# Patient Record
Sex: Male | Born: 1990 | Hispanic: Yes | Marital: Married | Smoking: Never smoker
Health system: Southern US, Community
[De-identification: ages and names within clinical notes are randomized; demographics above are authoritative.]

---

## 2013-12-11 ENCOUNTER — Emergency Department (HOSPITAL_COMMUNITY): Payer: Medicaid Other

## 2013-12-11 ENCOUNTER — Inpatient Hospital Stay (HOSPITAL_COMMUNITY)
Admission: EM | Admit: 2013-12-11 | Discharge: 2013-12-15 | DRG: 387 | Disposition: A | Discharge status: Home / Self Care | Payer: Medicaid Other | Attending: Internal Medicine | Admitting: Internal Medicine

## 2013-12-11 ENCOUNTER — Encounter (HOSPITAL_COMMUNITY): Payer: Self-pay | Admitting: Emergency Medicine

## 2013-12-11 DIAGNOSIS — S0101XA Laceration without foreign body of scalp, initial encounter: Secondary | ICD-10-CM | POA: Diagnosis present

## 2013-12-11 DIAGNOSIS — E876 Hypokalemia: Secondary | ICD-10-CM

## 2013-12-11 DIAGNOSIS — K5289 Other specified noninfective gastroenteritis and colitis: Secondary | ICD-10-CM

## 2013-12-11 DIAGNOSIS — R55 Syncope and collapse: Secondary | ICD-10-CM

## 2013-12-11 DIAGNOSIS — R296 Repeated falls: Secondary | ICD-10-CM | POA: Diagnosis present

## 2013-12-11 DIAGNOSIS — R109 Unspecified abdominal pain: Secondary | ICD-10-CM | POA: Diagnosis not present

## 2013-12-11 DIAGNOSIS — K5000 Crohn's disease of small intestine without complications: Secondary | ICD-10-CM | POA: Diagnosis not present

## 2013-12-11 DIAGNOSIS — D72829 Elevated white blood cell count, unspecified: Secondary | ICD-10-CM | POA: Diagnosis present

## 2013-12-11 DIAGNOSIS — K529 Noninfective gastroenteritis and colitis, unspecified: Secondary | ICD-10-CM

## 2013-12-11 DIAGNOSIS — R197 Diarrhea, unspecified: Secondary | ICD-10-CM

## 2013-12-11 DIAGNOSIS — S0100XA Unspecified open wound of scalp, initial encounter: Secondary | ICD-10-CM | POA: Diagnosis present

## 2013-12-11 DIAGNOSIS — A080 Rotaviral enteritis: Secondary | ICD-10-CM

## 2013-12-11 LAB — CBC WITH DIFFERENTIAL/PLATELET
BASOS PCT: 0 % (ref 0–1)
Basophils Absolute: 0 10*3/uL (ref 0.0–0.1)
EOS ABS: 0 10*3/uL (ref 0.0–0.7)
Eosinophils Relative: 0 % (ref 0–5)
HCT: 47.1 % (ref 39.0–52.0)
HEMOGLOBIN: 16.3 g/dL (ref 13.0–17.0)
Lymphocytes Relative: 10 % — ABNORMAL LOW (ref 12–46)
Lymphs Abs: 1.6 10*3/uL (ref 0.7–4.0)
MCH: 30 pg (ref 26.0–34.0)
MCHC: 34.6 g/dL (ref 30.0–36.0)
MCV: 86.7 fL (ref 78.0–100.0)
MONOS PCT: 11 % (ref 3–12)
Monocytes Absolute: 1.7 10*3/uL — ABNORMAL HIGH (ref 0.1–1.0)
Neutro Abs: 12.5 10*3/uL — ABNORMAL HIGH (ref 1.7–7.7)
Neutrophils Relative %: 79 % — ABNORMAL HIGH (ref 43–77)
PLATELETS: 201 10*3/uL (ref 150–400)
RBC: 5.43 MIL/uL (ref 4.22–5.81)
RDW: 12.9 % (ref 11.5–15.5)
WBC: 15.9 10*3/uL — ABNORMAL HIGH (ref 4.0–10.5)

## 2013-12-11 LAB — LIPASE, BLOOD: LIPASE: 18 U/L (ref 11–59)

## 2013-12-11 LAB — COMPREHENSIVE METABOLIC PANEL
ALBUMIN: 4.1 g/dL (ref 3.5–5.2)
ALK PHOS: 69 U/L (ref 39–117)
ALT: 18 U/L (ref 0–53)
AST: 19 U/L (ref 0–37)
BUN: 18 mg/dL (ref 6–23)
CO2: 22 mEq/L (ref 19–32)
Calcium: 9.3 mg/dL (ref 8.4–10.5)
Chloride: 101 mEq/L (ref 96–112)
Creatinine, Ser: 0.89 mg/dL (ref 0.50–1.35)
GFR calc Af Amer: >90 mL/min (ref >90)
GFR calc non Af Amer: >90 mL/min (ref >90)
Glucose, Bld: 120 mg/dL — ABNORMAL HIGH (ref 70–99)
Potassium: 3.4 mEq/L — ABNORMAL LOW (ref 3.7–5.3)
Sodium: 138 mEq/L (ref 137–147)
TOTAL PROTEIN: 7 g/dL (ref 6.0–8.3)
Total Bilirubin: 0.7 mg/dL (ref 0.3–1.2)

## 2013-12-11 MED ORDER — TETANUS-DIPHTH-ACELL PERTUSSIS 5-2.5-18.5 LF-MCG/0.5 IM SUSP
0.5000 mL | Freq: Once | INTRAMUSCULAR | Status: AC
  Administered 2013-12-11: 0.5 mL via INTRAMUSCULAR
  Filled 2013-12-11: qty 0.5

## 2013-12-11 MED ORDER — CIPROFLOXACIN IN D5W 400 MG/200ML IV SOLN
400.0000 mg | Freq: Once | INTRAVENOUS | Status: AC
  Administered 2013-12-11: 400 mg via INTRAVENOUS
  Filled 2013-12-11: qty 200

## 2013-12-11 MED ORDER — HYDROMORPHONE HCL PF 1 MG/ML IJ SOLN
1.0000 mg | Freq: Once | INTRAMUSCULAR | Status: AC
  Administered 2013-12-11: 1 mg via INTRAVENOUS
  Filled 2013-12-11: qty 1

## 2013-12-11 MED ORDER — METRONIDAZOLE IN NACL 5-0.79 MG/ML-% IV SOLN
500.0000 mg | Freq: Once | INTRAVENOUS | Status: AC
  Administered 2013-12-11: 500 mg via INTRAVENOUS
  Filled 2013-12-11: qty 100

## 2013-12-11 MED ORDER — ONDANSETRON HCL 4 MG/2ML IJ SOLN
4.0000 mg | Freq: Once | INTRAMUSCULAR | Status: AC
  Administered 2013-12-11: 4 mg via INTRAVENOUS
  Filled 2013-12-11: qty 2

## 2013-12-11 MED ORDER — IOHEXOL 300 MG/ML  SOLN
80.0000 mL | Freq: Once | INTRAMUSCULAR | Status: AC | PRN
  Administered 2013-12-11: 80 mL via INTRAVENOUS

## 2013-12-11 MED ORDER — SODIUM CHLORIDE 0.9 % IV BOLUS (SEPSIS)
1000.0000 mL | Freq: Once | INTRAVENOUS | Status: AC
  Administered 2013-12-11: 1000 mL via INTRAVENOUS

## 2013-12-11 MED ORDER — SODIUM CHLORIDE 0.9 % IV SOLN
INTRAVENOUS | Status: DC
  Administered 2013-12-11: via INTRAVENOUS

## 2013-12-11 NOTE — Consult Note (Signed)
Reason for Consult:  Acute abdominal pain Referring Physician:  Dr. Pattricia Boss  Erving Sassano is an 23 y.o. male.  HPI: Interviewed via phone interpreter  This is a 23 yo male in good health who presents with about 24 hours of diffuse abdominal pain.  He has had one episode of vomiting, but denies diarrhea.  He tried to eat a meal today, but the pain worsened.  He was in the bathroom and became syncopal.  He fell and hit the back of his head.  Scalp laceration is not bleeding.  He reports some subjective fever.  The pain is much improved and he is only tender with palpation.  History reviewed. No pertinent past medical history.  No past surgical history on file.  No family history on file.  Social History:  reports that he has never smoked. He does not have any smokeless tobacco history on file. He reports that he does not drink alcohol or use illicit drugs.  Allergies: No Known Allergies  Medications:  Prior to Admission medications   Medication Sig Start Date End Date Taking? Authorizing Provider  ibuprofen (ADVIL,MOTRIN) 200 MG tablet Take 200 mg by mouth every 6 (six) hours as needed.   Yes Historical Provider, MD     Results for orders placed during the hospital encounter of 12/11/13 (from the past 48 hour(s))  CBC WITH DIFFERENTIAL     Status: Abnormal   Collection Time    12/11/13  9:00 PM      Result Value Ref Range   WBC 15.9 (*) 4.0 - 10.5 K/uL   RBC 5.43  4.22 - 5.81 MIL/uL   Hemoglobin 16.3  13.0 - 17.0 g/dL   HCT 47.1  39.0 - 52.0 %   MCV 86.7  78.0 - 100.0 fL   MCH 30.0  26.0 - 34.0 pg   MCHC 34.6  30.0 - 36.0 g/dL   RDW 12.9  11.5 - 15.5 %   Platelets 201  150 - 400 K/uL   Neutrophils Relative % 79 (*) 43 - 77 %   Neutro Abs 12.5 (*) 1.7 - 7.7 K/uL   Lymphocytes Relative 10 (*) 12 - 46 %   Lymphs Abs 1.6  0.7 - 4.0 K/uL   Monocytes Relative 11  3 - 12 %   Monocytes Absolute 1.7 (*) 0.1 - 1.0 K/uL   Eosinophils Relative 0  0 - 5 %   Eosinophils  Absolute 0.0  0.0 - 0.7 K/uL   Basophils Relative 0  0 - 1 %   Basophils Absolute 0.0  0.0 - 0.1 K/uL  COMPREHENSIVE METABOLIC PANEL     Status: Abnormal   Collection Time    12/11/13  9:00 PM      Result Value Ref Range   Sodium 138  137 - 147 mEq/L   Potassium 3.4 (*) 3.7 - 5.3 mEq/L   Chloride 101  96 - 112 mEq/L   CO2 22  19 - 32 mEq/L   Glucose, Bld 120 (*) 70 - 99 mg/dL   BUN 18  6 - 23 mg/dL   Creatinine, Ser 0.89  0.50 - 1.35 mg/dL   Calcium 9.3  8.4 - 10.5 mg/dL   Total Protein 7.0  6.0 - 8.3 g/dL   Albumin 4.1  3.5 - 5.2 g/dL   AST 19  0 - 37 U/L   ALT 18  0 - 53 U/L   Alkaline Phosphatase 69  39 - 117 U/L   Total Bilirubin  0.7  0.3 - 1.2 mg/dL   GFR calc non Af Amer >90  >90 mL/min   GFR calc Af Amer >90  >90 mL/min   Comment: (NOTE)     The eGFR has been calculated using the CKD EPI equation.     This calculation has not been validated in all clinical situations.     eGFR's persistently <90 mL/min signify possible Chronic Kidney     Disease.  LIPASE, BLOOD     Status: None   Collection Time    12/11/13  9:00 PM      Result Value Ref Range   Lipase 18  11 - 59 U/L    Ct Head Wo Contrast  12/11/2013   CLINICAL DATA:  Syncopal episode, struck back of head. Abdominal pain, nausea, and vomiting.  EXAM: CT HEAD WITHOUT CONTRAST  TECHNIQUE: Contiguous axial images were obtained from the base of the skull through the vertex without intravenous contrast.  COMPARISON:  None.  FINDINGS: Ventricles and sulci are symmetrical. No mass effect or midline shift. No abnormal extra-axial fluid collections. Gray-white matter junctions are distinct. Basal cisterns are not effaced. No evidence of acute intracranial hemorrhage. No depressed skull fractures. Mild mucosal thickening in the paranasal sinuses.  IMPRESSION: No acute intracranial abnormalities.   Electronically Signed   By: Lucienne Capers M.D.   On: 12/11/2013 22:13   CLINICAL DATA: Abdominal pain, nausea and vomiting.  Syncopal  episode. Sudden onset of pain.  EXAM:  CT ABDOMEN AND PELVIS WITH CONTRAST  TECHNIQUE:  Multidetector CT imaging of the abdomen and pelvis was performed  using the standard protocol following bolus administration of  intravenous contrast.  CONTRAST: 14m OMNIPAQUE IOHEXOL 300 MG/ML SOLN  COMPARISON: None.  FINDINGS:  Lung bases are clear. The liver, spleen, gallbladder, pancreas,  adrenal glands, kidneys, abdominal aorta, and retroperitoneal lymph  nodes are unremarkable. The stomach and most of the small bowel are  decompressed. The colon is diffusely fluid-filled, most  predominantly in the right colon and the terminal ileum is also  fluid-filled with air-fluid levels. There is no small bowel  distention. There is evidence of small bowel wall thickening of the  terminal ileum and intermittent wall thickening throughout the  colon. Changes suggest an inflammatory process such is antra  colitis. This could represent infectious or inflammatory etiology.  Ischemia is felt less likely as there is no portal venous gas,  pneumatosis, and flow in the mesenteric vessels is normal appearing.  The appendix measures 8 mm in diameter. This is borderline and early  appendicitis is not excluded. Small amount of free fluid in the  right lower quadrant and mesenteries likely reactive.  Pelvis: No evidence of diverticulitis. The bladder wall is not  thickened. Small amount of free fluid in the pelvis is likely to be  reactive. No destructive bone lesions appreciated.  IMPRESSION:  Wall thickening and fluid in the terminal ileum and colon, with  small amount of free fluid in the right lower quadrant and pelvis  likely reactive. Changes likely to represent enterocolitis of  nonspecific etiology. Borderline appendiceal diameter.  Electronically Signed  By: WLucienne CapersM.D.   On: 12/11/2013 22:24   Review of Systems  Constitutional: Negative for weight loss.  HENT: Negative for ear  discharge, ear pain, hearing loss and tinnitus.   Eyes: Negative for blurred vision, double vision, photophobia and pain.  Respiratory: Negative for cough, sputum production and shortness of breath.   Cardiovascular: Negative for chest pain.  Gastrointestinal: Positive for abdominal pain. Negative for nausea and vomiting.  Genitourinary: Negative for dysuria, urgency, frequency and flank pain.  Musculoskeletal: Negative for back pain, falls, joint pain, myalgias and neck pain.  Neurological: Negative for dizziness, tingling, sensory change, focal weakness, loss of consciousness and headaches.  Endo/Heme/Allergies: Does not bruise/bleed easily.  Psychiatric/Behavioral: Negative for depression, memory loss and substance abuse. The patient is not nervous/anxious.    Blood pressure 131/78, pulse 97, temperature 97.6 F (36.4 C), temperature source Oral, resp. rate 17, height '5\' 11"'  (1.803 m), weight 180 lb (81.647 kg), SpO2 97.00%. Physical Exam WDWN in NAD HEENT:  EOMI, sclera anicteric Neck:  No masses, no thyromegaly Lungs:  CTA bilaterally; normal respiratory effort CV:  Regular rate and rhythm; no murmurs Abd:  +bowel sounds, soft, non-distended; mildly tender in all four quadrants Ext:  Well-perfused; no edema Skin:  Warm, dry; no sign of jaundice  Assessment/Plan: 1.  Syncopal episode with fall/ scalp laceration - no sign of intracranial injury; laceration stapled in ED 2.  Diffuse abdominal pain - improved in ED; appears to inflammatory or infectious; not really consistent with ischemic etiology (young age, no other sign of vascular disease).  This may have been going on for awhile, since his CT scan appears to show Pepto-Bismol in his colon.    Recs: No acute indications for surgical intervention Rehydration Bowel rest Consider stool studies if he begins to have diarrhea Empiric antibiotics to cover common GI pathogens If no improvement with conservative measures, consider GI  consult [facility] will continue to follow with you  Imogene Burn. Tzirel Leonor 12/11/2013, 10:25 PM

## 2013-12-11 NOTE — ED Provider Notes (Signed)
CSN: [id number]     Arrival date & time 12/11/13  2031 History   First MD Initiated Contact with Patient 12/11/13 2036     Chief Complaint  Patient presents with  . Abdominal Pain     (Consider location/radiation/quality/duration/timing/severity/associated sxs/prior Treatment) HPI 23 year old male came in initially stated he had a sudden onset of abdominal pain and a syncopal episode. Moaning and complaining of diffuse abdominal pain. After the initial evaluation more thorough history was obtained through a translator. He states that he had some abdominal discomfort that began last night and persisted through the night. He had a sudden onset of severe pain after eating today. He then had a syncopal episode that caused a laceration to his head. He had severe diffuse abdominal pain after eating. He has not had emesis or diarrhea, fever, chills, or urinary symptoms. He has not had any similar episodes in the past. History reviewed. No pertinent past medical history. No past surgical history on file. No family history on file. History  Substance Use Topics  . Smoking status: Never Smoker   . Smokeless tobacco: Not on file  . Alcohol Use: No    Review of Systems  Constitutional: Negative.   HENT: Negative.   Eyes: Negative.   Respiratory: Negative.   Cardiovascular: Negative.   Gastrointestinal: Positive for nausea and abdominal pain. Negative for vomiting and diarrhea.  Endocrine: Negative.   Genitourinary: Negative.   Musculoskeletal: Negative.   Skin: Negative.   Allergic/Immunologic: Negative.   Neurological: Negative.   Hematological: Negative.   Psychiatric/Behavioral: Negative.   All other systems reviewed and are negative.     Allergies  Review of patient's allergies indicates no known allergies.  Home Medications   Prior to Admission medications   Medication Sig Start Date End Date Taking? Authorizing Provider  ibuprofen (ADVIL,MOTRIN) 200 MG tablet Take 200 mg by  mouth every 6 (six) hours as needed.   Yes Historical Provider, MD   BP 131/78  Pulse 97  Temp(Src) 97.6 F (36.4 C) (Oral)  Resp 17  Ht 5\' 11"  (1.803 m)  Wt 180 lb (81.647 kg)  BMI 25.12 kg/m2  SpO2 97% Physical Exam  Nursing note and vitals reviewed. Constitutional: He is oriented to person, place, and time. He appears well-developed and well-nourished.  HENT:  Head: Normocephalic.  Right Ear: External ear normal.  Left Ear: External ear normal.  Nose: Nose normal.  Mouth/Throat: Oropharynx is clear and moist.  4 cm laceration to occiput  Eyes: Conjunctivae are normal. Pupils are equal, round, and reactive to light.  Neck: Normal range of motion. Neck supple.  Cardiovascular: Normal rate, regular rhythm, normal heart sounds and intact distal pulses.   Pulmonary/Chest: Breath sounds normal.  Abdominal: Bowel sounds are normal. He exhibits no distension and no mass. There is tenderness. There is guarding. There is no rebound.  Diffuse tenderness to palpation  Musculoskeletal: Normal range of motion.  Neurological: He is alert and oriented to person, place, and time. He has normal reflexes. He displays normal reflexes. No cranial nerve deficit. He exhibits normal muscle tone.  Skin: Skin is warm and dry.  Psychiatric: He has a normal mood and affect. His behavior is normal. Judgment and thought content normal.    ED Course  Procedures (including critical care time) Labs Review Labs Reviewed  CBC WITH DIFFERENTIAL - Abnormal; Notable for the following:    WBC 15.9 (*)    Neutrophils Relative % 79 (*)    Neutro Abs 12.5 (*)  Lymphocytes Relative 10 (*)    Monocytes Absolute 1.7 (*)    All other components within normal limits  COMPREHENSIVE METABOLIC PANEL - Abnormal; Notable for the following:    Potassium 3.4 (*)    Glucose, Bld 120 (*)    All other components within normal limits  LIPASE, BLOOD  URINALYSIS, ROUTINE W REFLEX MICROSCOPIC    Imaging Review Ct Head  Wo Contrast  12/11/2013   CLINICAL DATA:  Syncopal episode, struck back of head. Abdominal pain, nausea, and vomiting.  EXAM: CT HEAD WITHOUT CONTRAST  TECHNIQUE: Contiguous axial images were obtained from the base of the skull through the vertex without intravenous contrast.  COMPARISON:  None.  FINDINGS: Ventricles and sulci are symmetrical. No mass effect or midline shift. No abnormal extra-axial fluid collections. Gray-white matter junctions are distinct. Basal cisterns are not effaced. No evidence of acute intracranial hemorrhage. No depressed skull fractures. Mild mucosal thickening in the paranasal sinuses.  IMPRESSION: No acute intracranial abnormalities.   Electronically Signed   By: Burman NievesWilliam  Stevens M.D.   On: 12/11/2013 22:13   Ct Abdomen Pelvis W Contrast  12/11/2013   CLINICAL DATA:  Abdominal pain, nausea and vomiting. Syncopal episode. Sudden onset of pain.  EXAM: CT ABDOMEN AND PELVIS WITH CONTRAST  TECHNIQUE: Multidetector CT imaging of the abdomen and pelvis was performed using the standard protocol following bolus administration of intravenous contrast.  CONTRAST:  80mL OMNIPAQUE IOHEXOL 300 MG/ML  SOLN  COMPARISON:  None.  FINDINGS: Lung bases are clear. The liver, spleen, gallbladder, pancreas, adrenal glands, kidneys, abdominal aorta, and retroperitoneal lymph nodes are unremarkable. The stomach and most of the small bowel are decompressed. The colon is diffusely fluid-filled, most predominantly in the right colon and the terminal ileum is also fluid-filled with air-fluid levels. There is no small bowel distention. There is evidence of small bowel wall thickening of the terminal ileum and intermittent wall thickening throughout the colon. Changes suggest an inflammatory process such is antra colitis. This could represent infectious or inflammatory etiology. Ischemia is felt less likely as there is no portal venous gas, pneumatosis, and flow in the mesenteric vessels is normal appearing. The  appendix measures 8 mm in diameter. This is borderline and early appendicitis is not excluded. Small amount of free fluid in the right lower quadrant and mesenteries likely reactive.  Pelvis: No evidence of diverticulitis. The bladder wall is not thickened. Small amount of free fluid in the pelvis is likely to be reactive. No destructive bone lesions appreciated.  IMPRESSION: Wall thickening and fluid in the terminal ileum and colon, with small amount of free fluid in the right lower quadrant and pelvis likely reactive. Changes likely to represent enterocolitis of nonspecific etiology. Borderline appendiceal diameter.   Electronically Signed   By: Burman NievesWilliam  Stevens M.D.   On: 12/11/2013 22:24   Dg Abd Acute W/chest  12/11/2013   CLINICAL DATA:  Acute lower abdominal pain.  EXAM: ACUTE ABDOMEN SERIES (ABDOMEN 2 VIEW & CHEST 1 VIEW)  COMPARISON:  CT abdomen and pelvis 12/11/2013  FINDINGS: Mild prominence of gas-filled loops of mid abdominal small bowel with some air-fluid levels noted on the decubitus views. No radiopaque stones. Residual contrast material in the urinary tract from recent CT. Shallow inspiration. Lungs are clear. Normal heart size and pulmonary vascularity.  IMPRESSION: Nonspecific gas-filled loops of mid abdominal small bowel with air-fluid levels but without significant distention. No evidence of active pulmonary disease.   Electronically Signed   By: Burman NievesWilliam  Stevens  M.D.   On: 12/11/2013 22:48     EKG Interpretation None      MDM   Final diagnoses:  None    Patient with result presentation concerning for acute abdomen and surgery consulted. After CT was obtained which is reassuring for no free air or or acute intra-abdominal surgical issue, patient is reexamined and pain has decreased. However, CT does show evidence of small bowel wall thickening of the terminal ileum and intermittent wall thickening throughout the colon. Patient is being started on Cipro and Flagyl. He was low  normal blood pressure with a systolic blood pressure of 100 after a fluid bolus prior to arrival. EMS reported blood pressure in the mid 90s on their arrival and he had had a syncopal episode. His received a liter of normal saline here. Syncope appears to have been secondary to a vasovagal response. CT was obtained of his head and does not show any acute intracranial hemorrhage.  Please see laceration repair note by Elpidio Anis, PA-C.  Discussed with Dr. Adela Glimpse and she will admit for further treatment and evaluation.  Patient seen by Dr. Corliss Skains here in the ed secondary to initial assessment of concern for acute abdoment.  Dr. Corliss Skains will follow for general surgery.     Hilario Quarry, MD 12/11/13 [phone]

## 2013-12-11 NOTE — H&P (Signed)
PCP:  No PCP Per Patient    Chief Complaint:   Abdominal pain  HPI: Dean Fowler is a 23 y.o. male   has no past medical history on file.   Presented with  Severe abdominal pain for the past 24 hours. Denies diarrhea normal BM yesterday, He had some nausea and vomitng in ER but not prior. CT scan showed colitis, Hospitalist called for admission. 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 He had a slight ever earlier in the day. Family denies any exposure to under cooked meat. Patient initially had a syncopal episode resulting in head laceration that has been repaired in ER. Family did not provide much information regarding this. Most likely in response to pain.   Review of Systems:  ,   Pertinent positives include: Fevers, chills, abdominal pain, nausea, vomiting  Constitutional:  No weight loss, night sweats,  fatigue, weight loss  HEENT:  No headaches, Difficulty swallowing,Tooth/dental problems,Sore throat,  No sneezing, itching, ear ache, nasal congestion, post nasal drip,  Cardio-vascular:  No chest pain, Orthopnea, PND, anasarca, dizziness, palpitations.no Bilateral lower extremity swelling  GI:  No heartburn, indigestion, diarrhea, change in bowel habits, loss of appetite, melena, blood in stool, hematemesis Resp:  no shortness of breath at rest. No dyspnea on exertion, No excess mucus, no productive cough, No non-productive cough, No coughing up of blood.No change in color of mucus.No wheezing. Skin:  no rash or lesions. No jaundice GU:  no dysuria, change in color of urine, no urgency or frequency. No straining to urinate.  No flank pain.  Musculoskeletal:  No joint pain or no joint swelling. No decreased range of motion. No back pain.  Psych:  No change in mood or affect. No depression or anxiety. No memory loss.  Neuro: no localizing neurological complaints, no tingling, no weakness, no double vision, no gait abnormality, no slurred  speech, no confusion  Otherwise ROS are negative except for above, 10 systems were reviewed  Past Medical History: History reviewed. No pertinent past medical history. History reviewed. No pertinent past surgical history.   Medications: Prior to Admission medications   Medication Sig Start Date End Date Taking? Authorizing Provider  ibuprofen (ADVIL,MOTRIN) 200 MG tablet Take 200 mg by mouth every 6 (six) hours as needed.   Yes Historical Provider, MD    Allergies:  No Known Allergies  Social History:  Ambulatory   Independently  Lives at   Home with wife   reports that he has never smoked. He does not have any smokeless tobacco history on file. He reports that he does not drink alcohol or use illicit drugs.   Family History: family history is not on file.    Physical Exam: Patient Vitals for the past 24 hrs:  BP Temp Temp src Pulse Resp SpO2 Height Weight  12/11/13 2115 131/78 mmHg - - 97 17 97 % - -  12/11/13 2038 113/84 mmHg 97.6 F (36.4 C) Oral - - 96 % 5\' 11"  (1.803 m) 81.647 kg (180 lb)    1. General:  in No Acute distress 2. Psychological:lethargic after pain meds but Oriented 3. Head/ENT:     Dry Mucous Membranes                          Head Non traumatic, neck supple  Normal   Dentition 4. SKIN:  decreased Skin turgor,  Skin clean Dry and intact no rash 5. Heart: Regular rate and rhythm no Murmur, Rub or gallop 6. Lungs: Clear to auscultation bilaterally, no wheezes or crackles   7. Abdomen: Soft, diffusely tender, Non distended 8. Lower extremities: no clubbing, cyanosis, or edema 9. Neurologically Grossly intact, moving all 4 extremities equally 10. MSK: Normal range of motion  body mass index is 25.12 kg/(m^2).   Labs on Admission:   Recent Labs  12/11/13 2100  NA 138  K 3.4*  CL 101  CO2 22  GLUCOSE 120*  BUN 18  CREATININE 0.89  CALCIUM 9.3    Recent Labs  12/11/13 2100  AST 19  ALT 18  ALKPHOS 69   BILITOT 0.7  PROT 7.0  ALBUMIN 4.1    Recent Labs  12/11/13 2100  LIPASE 18    Recent Labs  12/11/13 2100  WBC 15.9*  NEUTROABS 12.5*  HGB 16.3  HCT 47.1  MCV 86.7  PLT 201   No results found for this basename: CKTOTAL, CKMB, CKMBINDEX, TROPONINI,  in the last 72 hours No results found for this basename: TSH, T4TOTAL, FREET3, T3FREE, THYROIDAB,  in the last 72 hours No results found for this basename: VITAMINB12, FOLATE, FERRITIN, TIBC, IRON, RETICCTPCT,  in the last 72 hours No results found for this basename: HGBA1C    Estimated Creatinine Clearance: 138.7 ml/min (by C-G formula based on Cr of 0.89). ABG No results found for this basename: phart, pco2, po2, hco3, tco2, acidbasedef, o2sat     No results found for this basename: DDIMER        Cultures: No results found for this basename: sdes, specrequest, cult, reptstatus       Radiological Exams on Admission: Ct Head Wo Contrast  12/11/2013   CLINICAL DATA:  Syncopal episode, struck back of head. Abdominal pain, nausea, and vomiting.  EXAM: CT HEAD WITHOUT CONTRAST  TECHNIQUE: Contiguous axial images were obtained from the base of the skull through the vertex without intravenous contrast.  COMPARISON:  None.  FINDINGS: Ventricles and sulci are symmetrical. No mass effect or midline shift. No abnormal extra-axial fluid collections. Gray-white matter junctions are distinct. Basal cisterns are not effaced. No evidence of acute intracranial hemorrhage. No depressed skull fractures. Mild mucosal thickening in the paranasal sinuses.  IMPRESSION: No acute intracranial abnormalities.   Electronically Signed   By: Burman NievesWilliam  Stevens M.D.   On: 12/11/2013 22:13   Ct Abdomen Pelvis W Contrast  12/11/2013   CLINICAL DATA:  Abdominal pain, nausea and vomiting. Syncopal episode. Sudden onset of pain.  EXAM: CT ABDOMEN AND PELVIS WITH CONTRAST  TECHNIQUE: Multidetector CT imaging of the abdomen and pelvis was performed using the  standard protocol following bolus administration of intravenous contrast.  CONTRAST:  80mL OMNIPAQUE IOHEXOL 300 MG/ML  SOLN  COMPARISON:  None.  FINDINGS: Lung bases are clear. The liver, spleen, gallbladder, pancreas, adrenal glands, kidneys, abdominal aorta, and retroperitoneal lymph nodes are unremarkable. The stomach and most of the small bowel are decompressed. The colon is diffusely fluid-filled, most predominantly in the right colon and the terminal ileum is also fluid-filled with air-fluid levels. There is no small bowel distention. There is evidence of small bowel wall thickening of the terminal ileum and intermittent wall thickening throughout the colon. Changes suggest an inflammatory process such is antra colitis. This could represent infectious or inflammatory etiology. Ischemia is felt less likely as there is no portal venous  gas, pneumatosis, and flow in the mesenteric vessels is normal appearing. The appendix measures 8 mm in diameter. This is borderline and early appendicitis is not excluded. Small amount of free fluid in the right lower quadrant and mesenteries likely reactive.  Pelvis: No evidence of diverticulitis. The bladder wall is not thickened. Small amount of free fluid in the pelvis is likely to be reactive. No destructive bone lesions appreciated.  IMPRESSION: Wall thickening and fluid in the terminal ileum and colon, with small amount of free fluid in the right lower quadrant and pelvis likely reactive. Changes likely to represent enterocolitis of nonspecific etiology. Borderline appendiceal diameter.   Electronically Signed   By: Burman Nieves M.D.   On: 12/11/2013 22:24   Dg Abd Acute W/chest  12/11/2013   CLINICAL DATA:  Acute lower abdominal pain.  EXAM: ACUTE ABDOMEN SERIES (ABDOMEN 2 VIEW & CHEST 1 VIEW)  COMPARISON:  CT abdomen and pelvis 12/11/2013  FINDINGS: Mild prominence of gas-filled loops of mid abdominal small bowel with some air-fluid levels noted on the decubitus  views. No radiopaque stones. Residual contrast material in the urinary tract from recent CT. Shallow inspiration. Lungs are clear. Normal heart size and pulmonary vascularity.  IMPRESSION: Nonspecific gas-filled loops of mid abdominal small bowel with air-fluid levels but without significant distention. No evidence of active pulmonary disease.   Electronically Signed   By: Burman Nieves M.D.   On: 12/11/2013 22:48    Chart has been reviewed  Assessment/Plan  23 year old gentleman here with diffuse colitis  Present on Admission:  . Colitis and terminal ileitis  - differential includes inflammatory bowel syndrome versus viral/infectious. Lactic acid within normal limits. The start of IV antibiotics. Would recommend GI consult in the morning.  . Hypokalemia we'll replace Syncope most likely vasovagal in the situation of severe pain. Overnight monitor on telemetry for completion sake will order echo gram  Prophylaxis: SCD , Protonix  CODE STATUS:FULL CODE  Other plan as per orders.  I have spent a total of 55 min on this admission  Adasyn Mcadams 12/11/2013, 11:50 PM

## 2013-12-11 NOTE — ED Notes (Signed)
Dr. Doutova at bedside.  

## 2013-12-11 NOTE — ED Notes (Signed)
The patient is unable to give urine specimen at this time. The patient has been advised to use call light for assistance to the restroom. The tech has reported to the RN in charge. 

## 2013-12-11 NOTE — ED Notes (Signed)
Patient arrived via [facility] from home with sudden onset of abdominal pain. No N/V, one episode of diarrhea. Patient took estomaquil at home. Patient had a syncopal episode where he struck the back of his head on the corner of the wall. Bleeding controlled. EMS states patient was hypotensive at 90 palpated. Patient was bolus NS prior to arrival. Patient is non english speaking, family at bedside.

## 2013-12-12 DIAGNOSIS — R296 Repeated falls: Secondary | ICD-10-CM | POA: Diagnosis present

## 2013-12-12 DIAGNOSIS — E876 Hypokalemia: Secondary | ICD-10-CM | POA: Diagnosis present

## 2013-12-12 DIAGNOSIS — S0100XA Unspecified open wound of scalp, initial encounter: Secondary | ICD-10-CM | POA: Diagnosis present

## 2013-12-12 DIAGNOSIS — D72829 Elevated white blood cell count, unspecified: Secondary | ICD-10-CM | POA: Diagnosis present

## 2013-12-12 DIAGNOSIS — R55 Syncope and collapse: Secondary | ICD-10-CM | POA: Diagnosis present

## 2013-12-12 DIAGNOSIS — K5000 Crohn's disease of small intestine without complications: Secondary | ICD-10-CM | POA: Diagnosis present

## 2013-12-12 DIAGNOSIS — R109 Unspecified abdominal pain: Secondary | ICD-10-CM | POA: Diagnosis present

## 2013-12-12 LAB — URINALYSIS, ROUTINE W REFLEX MICROSCOPIC
Bilirubin Urine: NEGATIVE
Glucose, UA: NEGATIVE mg/dL
Hgb urine dipstick: NEGATIVE
Ketones, ur: NEGATIVE mg/dL
LEUKOCYTES UA: NEGATIVE
NITRITE: NEGATIVE
PH: 7 (ref 5.0–8.0)
Protein, ur: NEGATIVE mg/dL
SPECIFIC GRAVITY, URINE: 1.033 — AB (ref 1.005–1.030)
Urobilinogen, UA: 1 mg/dL (ref 0.0–1.0)

## 2013-12-12 LAB — COMPREHENSIVE METABOLIC PANEL
ALT: 14 U/L (ref 0–53)
AST: 19 U/L (ref 0–37)
Albumin: 3.5 g/dL (ref 3.5–5.2)
Alkaline Phosphatase: 62 U/L (ref 39–117)
BUN: 13 mg/dL (ref 6–23)
CO2: 20 mEq/L (ref 19–32)
CREATININE: 0.83 mg/dL (ref 0.50–1.35)
Calcium: 8.5 mg/dL (ref 8.4–10.5)
Chloride: 104 mEq/L (ref 96–112)
GFR calc Af Amer: >90 mL/min (ref >90)
GFR calc non Af Amer: >90 mL/min (ref >90)
Glucose, Bld: 121 mg/dL — ABNORMAL HIGH (ref 70–99)
Potassium: 3.6 mEq/L — ABNORMAL LOW (ref 3.7–5.3)
Sodium: 138 mEq/L (ref 137–147)
TOTAL PROTEIN: 6.4 g/dL (ref 6.0–8.3)
Total Bilirubin: 1.1 mg/dL (ref 0.3–1.2)

## 2013-12-12 LAB — CBC
HEMATOCRIT: 43.1 % (ref 39.0–52.0)
Hemoglobin: 14.4 g/dL (ref 13.0–17.0)
MCH: 29.1 pg (ref 26.0–34.0)
MCHC: 33.4 g/dL (ref 30.0–36.0)
MCV: 87.2 fL (ref 78.0–100.0)
Platelets: 188 10*3/uL (ref 150–400)
RBC: 4.94 MIL/uL (ref 4.22–5.81)
RDW: 13.1 % (ref 11.5–15.5)
WBC: 23.2 10*3/uL — ABNORMAL HIGH (ref 4.0–10.5)

## 2013-12-12 LAB — PHOSPHORUS: Phosphorus: 2.9 mg/dL (ref 2.3–4.6)

## 2013-12-12 LAB — LACTIC ACID, PLASMA: LACTIC ACID, VENOUS: 1 mmol/L (ref 0.5–2.2)

## 2013-12-12 LAB — MAGNESIUM: MAGNESIUM: 1.7 mg/dL (ref 1.5–2.5)

## 2013-12-12 LAB — HIV ANTIBODY (ROUTINE TESTING W REFLEX): HIV 1&2 Ab, 4th Generation: NONREACTIVE

## 2013-12-12 LAB — TSH: TSH: 0.394 u[IU]/mL (ref 0.350–4.500)

## 2013-12-12 MED ORDER — HYDROMORPHONE HCL PF 1 MG/ML IJ SOLN: 0.5000 mg | INTRAMUSCULAR | Status: DC | PRN

## 2013-12-12 MED ORDER — CIPROFLOXACIN IN D5W 400 MG/200ML IV SOLN
400.0000 mg | Freq: Two times a day (BID) | INTRAVENOUS | Status: DC
  Administered 2013-12-12 – 2013-12-13 (×4): 400 mg via INTRAVENOUS
  Filled 2013-12-12 (×5): qty 200

## 2013-12-12 MED ORDER — HYDROCODONE-ACETAMINOPHEN 5-325 MG PO TABS: 1.0000 | ORAL_TABLET | ORAL | Status: DC | PRN

## 2013-12-12 MED ORDER — ACETAMINOPHEN 650 MG RE SUPP: 650.0000 mg | Freq: Four times a day (QID) | RECTAL | Status: DC | PRN

## 2013-12-12 MED ORDER — DOCUSATE SODIUM 100 MG PO CAPS: 100.0000 mg | ORAL_CAPSULE | Freq: Two times a day (BID) | ORAL | Status: DC

## 2013-12-12 MED ORDER — POTASSIUM CHLORIDE 10 MEQ/100ML IV SOLN
10.0000 meq | INTRAVENOUS | Status: AC
  Administered 2013-12-12 (×4): 10 meq via INTRAVENOUS
  Filled 2013-12-12 (×6): qty 100

## 2013-12-12 MED ORDER — SODIUM CHLORIDE 0.9 % IV BOLUS (SEPSIS)
500.0000 mL | Freq: Once | INTRAVENOUS | Status: AC
  Administered 2013-12-12: 500 mL via INTRAVENOUS

## 2013-12-12 MED ORDER — METRONIDAZOLE IN NACL 5-0.79 MG/ML-% IV SOLN
500.0000 mg | Freq: Three times a day (TID) | INTRAVENOUS | Status: DC
  Administered 2013-12-12 – 2013-12-14 (×7): 500 mg via INTRAVENOUS
  Filled 2013-12-12 (×8): qty 100

## 2013-12-12 MED ORDER — POTASSIUM CHLORIDE 10 MEQ/100ML IV SOLN
10.0000 meq | INTRAVENOUS | Status: AC
  Administered 2013-12-12 (×4): 10 meq via INTRAVENOUS
  Filled 2013-12-12 (×4): qty 100

## 2013-12-12 MED ORDER — SODIUM CHLORIDE 0.9 % IJ SOLN
3.0000 mL | Freq: Two times a day (BID) | INTRAMUSCULAR | Status: DC
  Administered 2013-12-12 – 2013-12-15 (×6): 3 mL via INTRAVENOUS

## 2013-12-12 MED ORDER — PANTOPRAZOLE SODIUM 40 MG IV SOLR
40.0000 mg | Freq: Every day | INTRAVENOUS | Status: DC
  Administered 2013-12-12 – 2013-12-13 (×2): 40 mg via INTRAVENOUS
  Filled 2013-12-12 (×3): qty 40

## 2013-12-12 MED ORDER — SODIUM CHLORIDE 0.9 % IV SOLN
INTRAVENOUS | Status: DC
  Administered 2013-12-12 – 2013-12-14 (×5): via INTRAVENOUS
  Administered 2013-12-14: 150 mL/h via INTRAVENOUS
  Administered 2013-12-15: 04:00:00 via INTRAVENOUS

## 2013-12-12 MED ORDER — ACETAMINOPHEN 325 MG PO TABS: 650.0000 mg | ORAL_TABLET | Freq: Four times a day (QID) | ORAL | Status: DC | PRN

## 2013-12-12 MED ORDER — ONDANSETRON HCL 4 MG/2ML IJ SOLN: 4.0000 mg | Freq: Four times a day (QID) | INTRAMUSCULAR | Status: DC | PRN

## 2013-12-12 MED ORDER — ONDANSETRON HCL 4 MG PO TABS: 4.0000 mg | ORAL_TABLET | Freq: Four times a day (QID) | ORAL | Status: DC | PRN

## 2013-12-12 MED ORDER — ENOXAPARIN SODIUM 40 MG/0.4ML ~~LOC~~ SOLN: 40.0000 mg | SUBCUTANEOUS | Status: DC

## 2013-12-12 MED ORDER — SODIUM CHLORIDE 0.9 % IV SOLN
INTRAVENOUS | Status: DC
  Administered 2013-12-12 (×2): via INTRAVENOUS

## 2013-12-12 NOTE — Progress Notes (Addendum)
Subjective: Language barrier; states he has some abd pain but not a lot; +loose stool; pt interviewed with phone interpreter. Says less pain, no n/v. +diarrhea x 3 since admission; pain primarily lower abdomen is where he says it is.   Objective: Vital signs in last 24 hours: Temp:  [97.6 F (36.4 C)-98.5 F (36.9 C)] 98.4 F (36.9 C) (04/19 0526) Pulse Rate:  [89-109] 89 (04/19 0526) Resp:  [17-20] 17 (04/19 0526) BP: (113-131)/(61-84) 118/61 mmHg (04/19 0526) SpO2:  [93 %-100 %] 100 % (04/19 0526) Weight:  [180 lb (81.647 kg)] 180 lb (81.647 kg) (04/18 2038)    Intake/Output from previous day: 04/18 0701 - 04/19 0700 In: -  Out: 230 [Urine:230] Intake/Output this shift:    Was asleep; easily awakens cta b/l Reg Soft, nd, TTP throughout more on Rt side and RLQ; no peritonitis/RT  Lab Results:   Recent Labs  12/11/13 2100 12/12/13 0812  WBC 15.9* 23.2*  HGB 16.3 14.4  HCT 47.1 43.1  PLT 201 188   BMET  Recent Labs  12/11/13 2100 12/12/13 0812  NA 138 138  K 3.4* 3.6*  CL 101 104  CO2 22 20  GLUCOSE 120* 121*  BUN 18 13  CREATININE 0.89 0.83  CALCIUM 9.3 8.5   PT/INR No results found for this basename: LABPROT, INR,  in the last 72 hours ABG No results found for this basename: PHART, PCO2, PO2, HCO3,  in the last 72 hours  Studies/Results: Ct Head Wo Contrast  12/11/2013   CLINICAL DATA:  Syncopal episode, struck back of head. Abdominal pain, nausea, and vomiting.  EXAM: CT HEAD WITHOUT CONTRAST  TECHNIQUE: Contiguous axial images were obtained from the base of the skull through the vertex without intravenous contrast.  COMPARISON:  None.  FINDINGS: Ventricles and sulci are symmetrical. No mass effect or midline shift. No abnormal extra-axial fluid collections. Gray-white matter junctions are distinct. Basal cisterns are not effaced. No evidence of acute intracranial hemorrhage. No depressed skull fractures. Mild mucosal thickening in the paranasal  sinuses.  IMPRESSION: No acute intracranial abnormalities.   Electronically Signed   By: Burman NievesWilliam  Stevens M.D.   On: 12/11/2013 22:13   Ct Abdomen Pelvis W Contrast  12/11/2013   CLINICAL DATA:  Abdominal pain, nausea and vomiting. Syncopal episode. Sudden onset of pain.  EXAM: CT ABDOMEN AND PELVIS WITH CONTRAST  TECHNIQUE: Multidetector CT imaging of the abdomen and pelvis was performed using the standard protocol following bolus administration of intravenous contrast.  CONTRAST:  80mL OMNIPAQUE IOHEXOL 300 MG/ML  SOLN  COMPARISON:  None.  FINDINGS: Lung bases are clear. The liver, spleen, gallbladder, pancreas, adrenal glands, kidneys, abdominal aorta, and retroperitoneal lymph nodes are unremarkable. The stomach and most of the small bowel are decompressed. The colon is diffusely fluid-filled, most predominantly in the right colon and the terminal ileum is also fluid-filled with air-fluid levels. There is no small bowel distention. There is evidence of small bowel wall thickening of the terminal ileum and intermittent wall thickening throughout the colon. Changes suggest an inflammatory process such is antra colitis. This could represent infectious or inflammatory etiology. Ischemia is felt less likely as there is no portal venous gas, pneumatosis, and flow in the mesenteric vessels is normal appearing. The appendix measures 8 mm in diameter. This is borderline and early appendicitis is not excluded. Small amount of free fluid in the right lower quadrant and mesenteries likely reactive.  Pelvis: No evidence of diverticulitis. The bladder wall is not thickened.  Small amount of free fluid in the pelvis is likely to be reactive. No destructive bone lesions appreciated.  IMPRESSION: Wall thickening and fluid in the terminal ileum and colon, with small amount of free fluid in the right lower quadrant and pelvis likely reactive. Changes likely to represent enterocolitis of nonspecific etiology. Borderline  appendiceal diameter.   Electronically Signed   By: Burman NievesWilliam  Stevens M.D.   On: 12/11/2013 22:24   Dg Abd Acute W/chest  12/11/2013   CLINICAL DATA:  Acute lower abdominal pain.  EXAM: ACUTE ABDOMEN SERIES (ABDOMEN 2 VIEW & CHEST 1 VIEW)  COMPARISON:  CT abdomen and pelvis 12/11/2013  FINDINGS: Mild prominence of gas-filled loops of mid abdominal small bowel with some air-fluid levels noted on the decubitus views. No radiopaque stones. Residual contrast material in the urinary tract from recent CT. Shallow inspiration. Lungs are clear. Normal heart size and pulmonary vascularity.  IMPRESSION: Nonspecific gas-filled loops of mid abdominal small bowel with air-fluid levels but without significant distention. No evidence of active pulmonary disease.   Electronically Signed   By: Burman NievesWilliam  Stevens M.D.   On: 12/11/2013 22:48    Anti-infectives: Anti-infectives   Start     Dose/Rate Route Frequency Ordered Stop   12/12/13 1000  ciprofloxacin (CIPRO) IVPB 400 mg     400 mg 200 mL/hr over 60 Minutes Intravenous Every 12 hours 12/12/13 0215     12/12/13 0600  metroNIDAZOLE (FLAGYL) IVPB 500 mg     500 mg 100 mL/hr over 60 Minutes Intravenous 3 times per day 12/12/13 0215     12/11/13 2300  metroNIDAZOLE (FLAGYL) IVPB 500 mg     500 mg 100 mL/hr over 60 Minutes Intravenous  Once 12/11/13 2245 12/12/13 0047   12/11/13 2245  ciprofloxacin (CIPRO) IVPB 400 mg     400 mg 200 mL/hr over 60 Minutes Intravenous  Once 12/11/13 2245 12/12/13 0047      Assessment/Plan: abd pain Diarrhea Leukocytosis  CT suggests some form of enteritis. Probably infectious vs inflammatory.  Cont bowel rest, IV abx rec GI consult Agree with stool pathogens Follow labs  Mary SellaEric M. Andrey CampanileWilson, MD, FACS General, Bariatric, & Minimally Invasive [facility] [place]   LOS: 1 day    Dean Fowler 12/12/2013

## 2013-12-12 NOTE — Progress Notes (Signed)
Pt has been ordered 4 runs of potassium. We have had difficulty obtaining medication from pharmacy. Will get all runs in when medication is available.

## 2013-12-12 NOTE — Progress Notes (Signed)
[facility] PROGRESS NOTE  Leta JunglingFransisco Agostini HQI:[id number]:[id number] DOB: 06-19-1991 DOA: 12/11/2013 PCP: No PCP Per Patient  Assessment/Plan: 1. Colitis; terminal ileitis;  CT  abdomen; Wall thickening and fluid in the terminal ileum and colon, with small amount of free fluid in the right lower quadrant and pelvis likely reactive. Changes likely to represent enterocolitis of nonspecific etiology. Borderline appendiceal diameter. -No family history of chron diseases or ulcerative colitis.  -Continue with ciprofloxacin and flagyl.  -WBC increased this am but patient clinically improving.  -If no improvement of condition will consider consult GI.  -Will order GI pathogen.  -Surgery following.  -Continue with NPO status.   2. Syncope; likely vaso-vagal. Patient relates episode happen after severe episode of abdominal pain. Will discontinue ECHO. IV fluids.   3. DVT prophylaxis; SCD. Out of bed.  4-Hypokalemia; labs pending.  5-Health screening; check HIV.   Code Status: full code.  Family Communication: care discussed with patient and wife who was at bedside.  Disposition Plan: remain inpatient.    Consultants:  surgery  Procedures:  none  Antibiotics:  Ciprofloxacin 4-18  Flagyl; 4-18  HPI/Subjective: Feeling better. Abdominal pain started 2 days prior to admission. He started to have watery stool this am. He relates abdominal pain is better 3/10. Yesterday 10/10 intensity.    Objective: Filed Vitals:   12/12/13 0526  BP: 118/61  Pulse: 89  Temp: 98.4 F (36.9 C)  Resp: 17    Intake/Output Summary (Last 24 hours) at 12/12/13 0921 Last data filed at 12/12/13 0500  Gross per 24 hour  Intake      0 ml  Output    230 ml  Net   -230 ml   Filed Weights   12/11/13 2038  Weight: 81.647 kg (180 lb)    Exam:   General:  Alert , awake. In no distress.   Cardiovascular: S 1, S 2 RRR  Respiratory: CTA  Abdomen: BS present, soft, mild generalized tenderness. No  rigidity.   Musculoskeletal: no edema.   Data Reviewed: Basic Metabolic Panel:  Recent Labs Lab 12/11/13 2100  NA 138  K 3.4*  CL 101  CO2 22  GLUCOSE 120*  BUN 18  CREATININE 0.89  CALCIUM 9.3   Liver Function Tests:  Recent Labs Lab 12/11/13 2100  AST 19  ALT 18  ALKPHOS 69  BILITOT 0.7  PROT 7.0  ALBUMIN 4.1    Recent Labs Lab 12/11/13 2100  LIPASE 18   No results found for this basename: AMMONIA,  in the last 168 hours CBC:  Recent Labs Lab 12/11/13 2100 12/12/13 0812  WBC 15.9* 23.2*  NEUTROABS 12.5*  --   HGB 16.3 14.4  HCT 47.1 43.1  MCV 86.7 87.2  PLT 201 188   Cardiac Enzymes: No results found for this basename: CKTOTAL, CKMB, CKMBINDEX, TROPONINI,  in the last 168 hours BNP (last 3 results) No results found for this basename: PROBNP,  in the last 8760 hours CBG: No results found for this basename: GLUCAP,  in the last 168 hours  No results found for this or any previous visit (from the past 240 hour(s)).   Studies: Ct Head Wo Contrast  12/11/2013   CLINICAL DATA:  Syncopal episode, struck back of head. Abdominal pain, nausea, and vomiting.  EXAM: CT HEAD WITHOUT CONTRAST  TECHNIQUE: Contiguous axial images were obtained from the base of the skull through the vertex without intravenous contrast.  COMPARISON:  None.  FINDINGS: Ventricles and sulci are symmetrical. No  mass effect or midline shift. No abnormal extra-axial fluid collections. Gray-white matter junctions are distinct. Basal cisterns are not effaced. No evidence of acute intracranial hemorrhage. No depressed skull fractures. Mild mucosal thickening in the paranasal sinuses.  IMPRESSION: No acute intracranial abnormalities.   Electronically Signed   By: Burman NievesWilliam  Stevens M.D.   On: 12/11/2013 22:13   Ct Abdomen Pelvis W Contrast  12/11/2013   CLINICAL DATA:  Abdominal pain, nausea and vomiting. Syncopal episode. Sudden onset of pain.  EXAM: CT ABDOMEN AND PELVIS WITH CONTRAST   TECHNIQUE: Multidetector CT imaging of the abdomen and pelvis was performed using the standard protocol following bolus administration of intravenous contrast.  CONTRAST:  80mL OMNIPAQUE IOHEXOL 300 MG/ML  SOLN  COMPARISON:  None.  FINDINGS: Lung bases are clear. The liver, spleen, gallbladder, pancreas, adrenal glands, kidneys, abdominal aorta, and retroperitoneal lymph nodes are unremarkable. The stomach and most of the small bowel are decompressed. The colon is diffusely fluid-filled, most predominantly in the right colon and the terminal ileum is also fluid-filled with air-fluid levels. There is no small bowel distention. There is evidence of small bowel wall thickening of the terminal ileum and intermittent wall thickening throughout the colon. Changes suggest an inflammatory process such is antra colitis. This could represent infectious or inflammatory etiology. Ischemia is felt less likely as there is no portal venous gas, pneumatosis, and flow in the mesenteric vessels is normal appearing. The appendix measures 8 mm in diameter. This is borderline and early appendicitis is not excluded. Small amount of free fluid in the right lower quadrant and mesenteries likely reactive.  Pelvis: No evidence of diverticulitis. The bladder wall is not thickened. Small amount of free fluid in the pelvis is likely to be reactive. No destructive bone lesions appreciated.  IMPRESSION: Wall thickening and fluid in the terminal ileum and colon, with small amount of free fluid in the right lower quadrant and pelvis likely reactive. Changes likely to represent enterocolitis of nonspecific etiology. Borderline appendiceal diameter.   Electronically Signed   By: Burman NievesWilliam  Stevens M.D.   On: 12/11/2013 22:24   Dg Abd Acute W/chest  12/11/2013   CLINICAL DATA:  Acute lower abdominal pain.  EXAM: ACUTE ABDOMEN SERIES (ABDOMEN 2 VIEW & CHEST 1 VIEW)  COMPARISON:  CT abdomen and pelvis 12/11/2013  FINDINGS: Mild prominence of gas-filled  loops of mid abdominal small bowel with some air-fluid levels noted on the decubitus views. No radiopaque stones. Residual contrast material in the urinary tract from recent CT. Shallow inspiration. Lungs are clear. Normal heart size and pulmonary vascularity.  IMPRESSION: Nonspecific gas-filled loops of mid abdominal small bowel with air-fluid levels but without significant distention. No evidence of active pulmonary disease.   Electronically Signed   By: Burman NievesWilliam  Stevens M.D.   On: 12/11/2013 22:48    Scheduled Meds: . ciprofloxacin  400 mg Intravenous Q12H  . docusate sodium  100 mg Oral BID  . metronidazole  500 mg Intravenous 3 times per day  . pantoprazole (PROTONIX) IV  40 mg Intravenous QHS  . sodium chloride  3 mL Intravenous Q12H   Continuous Infusions: . sodium chloride      Active Problems:   Colitis   Hypokalemia    Time spent: 35 minutes.     Belkys A Regalado  [facility] Pager [phone]. If 7PM-7AM, please contact night-coverage at [email or web address], password [email or web address] 12/12/2013, 9:21 AM  LOS: 1 day

## 2013-12-13 LAB — GI PATHOGEN PANEL BY PCR, STOOL
C DIFFICILE TOXIN A/B: NEGATIVE
CRYPTOSPORIDIUM BY PCR: NEGATIVE
Campylobacter by PCR: NEGATIVE
E COLI 0157 BY PCR: NEGATIVE
E coli (ETEC) LT/ST: NEGATIVE
E coli (STEC): NEGATIVE
G lamblia by PCR: NEGATIVE
Norovirus GI/GII: NEGATIVE
Rotavirus A by PCR: POSITIVE
SALMONELLA BY PCR: NEGATIVE
SHIGELLA BY PCR: NEGATIVE

## 2013-12-13 LAB — CBC
HEMATOCRIT: 42 % (ref 39.0–52.0)
Hemoglobin: 14 g/dL (ref 13.0–17.0)
MCH: 29.7 pg (ref 26.0–34.0)
MCHC: 33.3 g/dL (ref 30.0–36.0)
MCV: 89 fL (ref 78.0–100.0)
Platelets: 171 10*3/uL (ref 150–400)
RBC: 4.72 MIL/uL (ref 4.22–5.81)
RDW: 13.3 % (ref 11.5–15.5)
WBC: 18.3 10*3/uL — ABNORMAL HIGH (ref 4.0–10.5)

## 2013-12-13 LAB — BASIC METABOLIC PANEL
BUN: 12 mg/dL (ref 6–23)
CO2: 19 mEq/L (ref 19–32)
CREATININE: 0.77 mg/dL (ref 0.50–1.35)
Calcium: 8.9 mg/dL (ref 8.4–10.5)
Chloride: 105 mEq/L (ref 96–112)
GFR calc non Af Amer: >90 mL/min (ref >90)
Glucose, Bld: 106 mg/dL — ABNORMAL HIGH (ref 70–99)
Potassium: 3.6 mEq/L — ABNORMAL LOW (ref 3.7–5.3)
Sodium: 138 mEq/L (ref 137–147)

## 2013-12-13 MED ORDER — POTASSIUM CHLORIDE 10 MEQ/100ML IV SOLN
10.0000 meq | INTRAVENOUS | Status: AC
  Administered 2013-12-13 (×4): 10 meq via INTRAVENOUS
  Filled 2013-12-13: qty 100

## 2013-12-13 MED ORDER — MAGNESIUM SULFATE 40 MG/ML IJ SOLN
2.0000 g | Freq: Once | INTRAMUSCULAR | Status: AC
  Administered 2013-12-13: 2 g via INTRAVENOUS
  Filled 2013-12-13: qty 50

## 2013-12-13 NOTE — Progress Notes (Signed)
Subjective: Pt feeling much better.  Continues to have loose watery stools today.  No N/V.  Abdominal pain improved.  Ambulating well.    Objective: Vital signs in last 24 hours: Temp:  [97.3 F (36.3 C)-98.4 F (36.9 C)] 98.3 F (36.8 C) (04/20 0619) Pulse Rate:  [78-93] 82 (04/20 0619) Resp:  [14-17] 17 (04/20 0619) BP: (93-113)/(47-60) 106/57 mmHg (04/20 0619) SpO2:  [98 %-100 %] 99 % (04/20 0619) Last BM Date: 12/12/13  Intake/Output from previous day: 04/19 0701 - 04/20 0700 In: 0  Out: 900 [Urine:500; Stool:400] Intake/Output this shift:    PE: Gen:  Alert, NAD, pleasant Abd: Soft, minimal tenderness in the lower abdomen, ND, +BS, no HSM, no scars noted   Lab Results:   Recent Labs  12/12/13 0812 12/13/13 0340  WBC 23.2* 18.3*  HGB 14.4 14.0  HCT 43.1 42.0  PLT 188 171   BMET  Recent Labs  12/12/13 0812 12/13/13 0340  NA 138 138  K 3.6* 3.6*  CL 104 105  CO2 20 19  GLUCOSE 121* 106*  BUN 13 12  CREATININE 0.83 0.77  CALCIUM 8.5 8.9   PT/INR No results found for this basename: LABPROT, INR,  in the last 72 hours CMP     Component Value Date/Time   NA 138 12/13/2013 0340   K 3.6* 12/13/2013 0340   CL 105 12/13/2013 0340   CO2 19 12/13/2013 0340   GLUCOSE 106* 12/13/2013 0340   BUN 12 12/13/2013 0340   CREATININE 0.77 12/13/2013 0340   CALCIUM 8.9 12/13/2013 0340   PROT 6.4 12/12/2013 0812   ALBUMIN 3.5 12/12/2013 0812   AST 19 12/12/2013 0812   ALT 14 12/12/2013 0812   ALKPHOS 62 12/12/2013 0812   BILITOT 1.1 12/12/2013 0812   GFRNONAA >90 12/13/2013 0340   GFRAA >90 12/13/2013 0340   Lipase     Component Value Date/Time   LIPASE 18 12/11/2013 2100       Studies/Results: Ct Head Wo Contrast  12/11/2013   CLINICAL DATA:  Syncopal episode, struck back of head. Abdominal pain, nausea, and vomiting.  EXAM: CT HEAD WITHOUT CONTRAST  TECHNIQUE: Contiguous axial images were obtained from the base of the skull through the vertex without intravenous  contrast.  COMPARISON:  None.  FINDINGS: Ventricles and sulci are symmetrical. No mass effect or midline shift. No abnormal extra-axial fluid collections. Gray-white matter junctions are distinct. Basal cisterns are not effaced. No evidence of acute intracranial hemorrhage. No depressed skull fractures. Mild mucosal thickening in the paranasal sinuses.  IMPRESSION: No acute intracranial abnormalities.   Electronically Signed   By: Burman NievesWilliam  Stevens M.D.   On: 12/11/2013 22:13   Ct Abdomen Pelvis W Contrast  12/11/2013   CLINICAL DATA:  Abdominal pain, nausea and vomiting. Syncopal episode. Sudden onset of pain.  EXAM: CT ABDOMEN AND PELVIS WITH CONTRAST  TECHNIQUE: Multidetector CT imaging of the abdomen and pelvis was performed using the standard protocol following bolus administration of intravenous contrast.  CONTRAST:  80mL OMNIPAQUE IOHEXOL 300 MG/ML  SOLN  COMPARISON:  None.  FINDINGS: Lung bases are clear. The liver, spleen, gallbladder, pancreas, adrenal glands, kidneys, abdominal aorta, and retroperitoneal lymph nodes are unremarkable. The stomach and most of the small bowel are decompressed. The colon is diffusely fluid-filled, most predominantly in the right colon and the terminal ileum is also fluid-filled with air-fluid levels. There is no small bowel distention. There is evidence of small bowel wall thickening of the terminal ileum and  intermittent wall thickening throughout the colon. Changes suggest an inflammatory process such is antra colitis. This could represent infectious or inflammatory etiology. Ischemia is felt less likely as there is no portal venous gas, pneumatosis, and flow in the mesenteric vessels is normal appearing. The appendix measures 8 mm in diameter. This is borderline and early appendicitis is not excluded. Small amount of free fluid in the right lower quadrant and mesenteries likely reactive.  Pelvis: No evidence of diverticulitis. The bladder wall is not thickened. Small  amount of free fluid in the pelvis is likely to be reactive. No destructive bone lesions appreciated.  IMPRESSION: Wall thickening and fluid in the terminal ileum and colon, with small amount of free fluid in the right lower quadrant and pelvis likely reactive. Changes likely to represent enterocolitis of nonspecific etiology. Borderline appendiceal diameter.   Electronically Signed   By: Burman NievesWilliam  Stevens M.D.   On: 12/11/2013 22:24   Dg Abd Acute W/chest  12/11/2013   CLINICAL DATA:  Acute lower abdominal pain.  EXAM: ACUTE ABDOMEN SERIES (ABDOMEN 2 VIEW & CHEST 1 VIEW)  COMPARISON:  CT abdomen and pelvis 12/11/2013  FINDINGS: Mild prominence of gas-filled loops of mid abdominal small bowel with some air-fluid levels noted on the decubitus views. No radiopaque stones. Residual contrast material in the urinary tract from recent CT. Shallow inspiration. Lungs are clear. Normal heart size and pulmonary vascularity.  IMPRESSION: Nonspecific gas-filled loops of mid abdominal small bowel with air-fluid levels but without significant distention. No evidence of active pulmonary disease.   Electronically Signed   By: Burman NievesWilliam  Stevens M.D.   On: 12/11/2013 22:48    Anti-infectives: Anti-infectives   Start     Dose/Rate Route Frequency Ordered Stop   12/12/13 1000  ciprofloxacin (CIPRO) IVPB 400 mg     400 mg 200 mL/hr over 60 Minutes Intravenous Every 12 hours 12/12/13 0215     12/12/13 0600  metroNIDAZOLE (FLAGYL) IVPB 500 mg     500 mg 100 mL/hr over 60 Minutes Intravenous 3 times per day 12/12/13 0215     12/11/13 2300  metroNIDAZOLE (FLAGYL) IVPB 500 mg     500 mg 100 mL/hr over 60 Minutes Intravenous  Once 12/11/13 2245 12/12/13 0047   12/11/13 2245  ciprofloxacin (CIPRO) IVPB 400 mg     400 mg 200 mL/hr over 60 Minutes Intravenous  Once 12/11/13 2245 12/12/13 0047       Assessment/Plan Abdominal pain Diarrhea  Leukocytosis - improved to 18 Colitis/ileitis  Plan: 1.  CT suggests some form  of enteritis. Probably infectious vs inflammatory.  2.  Okay to start clear liquids, IV abx  3.  Rec. GI consult if no improvement 4.  Agree with stool pathogens  5.  Follow labs 6.  Likely no need for surgery     LOS: 2 days    Dean Fowler 12/13/2013, 9:12 AM Pager: [phone]

## 2013-12-13 NOTE — Progress Notes (Signed)
[facility] PROGRESS NOTE  Arif Amendola ZOX:[id number] DOB: 1991/03/14 DOA: 12/11/2013 PCP: No PCP Per Patient  Assessment/Plan: 1. Colitis; terminal ileitis;  -CT  abdomen; Wall thickening and fluid in the terminal ileum and colon, with small amount of free fluid in the right lower quadrant and pelvis likely reactive. Changes likely to represent enterocolitis of nonspecific etiology. Borderline appendiceal diameter. -No family history of chron diseases or ulcerative colitis.  -Continue with ciprofloxacin and flagyl day 2.  -WBC decrease to 18 from 23.  -If no improvement of condition will consider consult GI.  -GI pathogen pending. -Surgery following.  -start clear diet if ok with surgery.   2. Syncope; likely vaso-vagal. Patient relates episode happen after severe episode of abdominal pain.. IV fluids.   3. DVT prophylaxis; SCD. Out of bed.  4-Hypokalemia; replace Mg and Potassium.  5-Health screening;  HIV negative.    Code Status: full code.  Family Communication: care discussed with patient and wife who was at bedside.  Disposition Plan: remain inpatient.    Consultants:  surgery  Procedures:  none  Antibiotics:  Ciprofloxacin 4-19  Flagyl; 4-19  HPI/Subjective: Feeling better. Has had 5 BM, watery stool this am. Abdominal pain 1/10. Pain get worse with palpation.    Objective: Filed Vitals:   12/13/13 0619  BP: 106/57  Pulse: 82  Temp: 98.3 F (36.8 C)  Resp: 17    Intake/Output Summary (Last 24 hours) at 12/13/13 0854 Last data filed at 12/12/13 1732  Gross per 24 hour  Intake      0 ml  Output    900 ml  Net   -900 ml   Filed Weights   12/11/13 2038  Weight: 81.647 kg (180 lb)    Exam:   General:  Alert , awake. In no distress.   Cardiovascular: S 1, S 2 RRR  Respiratory: CTA  Abdomen: BS present, soft, mild generalized tenderness. No rigidity.   Musculoskeletal: no edema.   Data Reviewed: Basic Metabolic  Panel:  Recent Labs Lab 12/11/13 2100 12/12/13 0812 12/13/13 0340  NA 138 138 138  K 3.4* 3.6* 3.6*  CL 101 104 105  CO2 22 20 19   GLUCOSE 120* 121* 106*  BUN 18 13 12   CREATININE 0.89 0.83 0.77  CALCIUM 9.3 8.5 8.9  MG  --  1.7  --   PHOS  --  2.9  --    Liver Function Tests:  Recent Labs Lab 12/11/13 2100 12/12/13 0812  AST 19 19  ALT 18 14  ALKPHOS 69 62  BILITOT 0.7 1.1  PROT 7.0 6.4  ALBUMIN 4.1 3.5    Recent Labs Lab 12/11/13 2100  LIPASE 18   No results found for this basename: AMMONIA,  in the last 168 hours CBC:  Recent Labs Lab 12/11/13 2100 12/12/13 0812 12/13/13 0340  WBC 15.9* 23.2* 18.3*  NEUTROABS 12.5*  --   --   HGB 16.3 14.4 14.0  HCT 47.1 43.1 42.0  MCV 86.7 87.2 89.0  PLT 201 188 171   Cardiac Enzymes: No results found for this basename: CKTOTAL, CKMB, CKMBINDEX, TROPONINI,  in the last 168 hours BNP (last 3 results) No results found for this basename: PROBNP,  in the last 8760 hours CBG: No results found for this basename: GLUCAP,  in the last 168 hours  No results found for this or any previous visit (from the past 240 hour(s)).   Studies: Ct Head Wo Contrast  12/11/2013   CLINICAL DATA:  Syncopal episode, struck back of head. Abdominal pain, nausea, and vomiting.  EXAM: CT HEAD WITHOUT CONTRAST  TECHNIQUE: Contiguous axial images were obtained from the base of the skull through the vertex without intravenous contrast.  COMPARISON:  None.  FINDINGS: Ventricles and sulci are symmetrical. No mass effect or midline shift. No abnormal extra-axial fluid collections. Gray-white matter junctions are distinct. Basal cisterns are not effaced. No evidence of acute intracranial hemorrhage. No depressed skull fractures. Mild mucosal thickening in the paranasal sinuses.  IMPRESSION: No acute intracranial abnormalities.   Electronically Signed   By: Burman NievesWilliam  Stevens M.D.   On: 12/11/2013 22:13   Ct Abdomen Pelvis W Contrast  12/11/2013    CLINICAL DATA:  Abdominal pain, nausea and vomiting. Syncopal episode. Sudden onset of pain.  EXAM: CT ABDOMEN AND PELVIS WITH CONTRAST  TECHNIQUE: Multidetector CT imaging of the abdomen and pelvis was performed using the standard protocol following bolus administration of intravenous contrast.  CONTRAST:  80mL OMNIPAQUE IOHEXOL 300 MG/ML  SOLN  COMPARISON:  None.  FINDINGS: Lung bases are clear. The liver, spleen, gallbladder, pancreas, adrenal glands, kidneys, abdominal aorta, and retroperitoneal lymph nodes are unremarkable. The stomach and most of the small bowel are decompressed. The colon is diffusely fluid-filled, most predominantly in the right colon and the terminal ileum is also fluid-filled with air-fluid levels. There is no small bowel distention. There is evidence of small bowel wall thickening of the terminal ileum and intermittent wall thickening throughout the colon. Changes suggest an inflammatory process such is antra colitis. This could represent infectious or inflammatory etiology. Ischemia is felt less likely as there is no portal venous gas, pneumatosis, and flow in the mesenteric vessels is normal appearing. The appendix measures 8 mm in diameter. This is borderline and early appendicitis is not excluded. Small amount of free fluid in the right lower quadrant and mesenteries likely reactive.  Pelvis: No evidence of diverticulitis. The bladder wall is not thickened. Small amount of free fluid in the pelvis is likely to be reactive. No destructive bone lesions appreciated.  IMPRESSION: Wall thickening and fluid in the terminal ileum and colon, with small amount of free fluid in the right lower quadrant and pelvis likely reactive. Changes likely to represent enterocolitis of nonspecific etiology. Borderline appendiceal diameter.   Electronically Signed   By: Burman NievesWilliam  Stevens M.D.   On: 12/11/2013 22:24   Dg Abd Acute W/chest  12/11/2013   CLINICAL DATA:  Acute lower abdominal pain.  EXAM:  ACUTE ABDOMEN SERIES (ABDOMEN 2 VIEW & CHEST 1 VIEW)  COMPARISON:  CT abdomen and pelvis 12/11/2013  FINDINGS: Mild prominence of gas-filled loops of mid abdominal small bowel with some air-fluid levels noted on the decubitus views. No radiopaque stones. Residual contrast material in the urinary tract from recent CT. Shallow inspiration. Lungs are clear. Normal heart size and pulmonary vascularity.  IMPRESSION: Nonspecific gas-filled loops of mid abdominal small bowel with air-fluid levels but without significant distention. No evidence of active pulmonary disease.   Electronically Signed   By: Burman NievesWilliam  Stevens M.D.   On: 12/11/2013 22:48    Scheduled Meds: . ciprofloxacin  400 mg Intravenous Q12H  . magnesium sulfate 1 - 4 g bolus IVPB  2 g Intravenous Once  . metronidazole  500 mg Intravenous 3 times per day  . pantoprazole (PROTONIX) IV  40 mg Intravenous QHS  . potassium chloride  10 mEq Intravenous Q1 Hr x 4  . sodium chloride  3 mL Intravenous Q12H  Continuous Infusions: . sodium chloride 125 mL/hr at 12/13/13 [id number]    Active Problems:   Colitis   Hypokalemia    Time spent: 35 minutes.     Dalton Mille A Makynzi Eastland  [facility] Pager [phone]. If 7PM-7AM, please contact night-coverage at [email or web address], password [email or web address] 12/13/2013, 8:54 AM  LOS: 2 days

## 2013-12-13 NOTE — Progress Notes (Signed)
I have seen and examined the patient and agree with the assessment and plans. I believe this is infectious and not ischemic. No plans for surgery Will be available if needed  Jackquelyn Sundberg A. Magnus IvanBlackman  MD, FACS

## 2013-12-14 LAB — BASIC METABOLIC PANEL
BUN: 10 mg/dL (ref 6–23)
CO2: 19 mEq/L (ref 19–32)
Calcium: 8.6 mg/dL (ref 8.4–10.5)
Chloride: 104 mEq/L (ref 96–112)
Creatinine, Ser: 0.74 mg/dL (ref 0.50–1.35)
GFR calc Af Amer: >90 mL/min (ref >90)
Glucose, Bld: 100 mg/dL — ABNORMAL HIGH (ref 70–99)
Potassium: 3.6 mEq/L — ABNORMAL LOW (ref 3.7–5.3)
Sodium: 137 mEq/L (ref 137–147)

## 2013-12-14 LAB — CBC
HEMATOCRIT: 40.6 % (ref 39.0–52.0)
Hemoglobin: 13.6 g/dL (ref 13.0–17.0)
MCH: 29.6 pg (ref 26.0–34.0)
MCHC: 33.5 g/dL (ref 30.0–36.0)
MCV: 88.5 fL (ref 78.0–100.0)
Platelets: 199 10*3/uL (ref 150–400)
RBC: 4.59 MIL/uL (ref 4.22–5.81)
RDW: 13.2 % (ref 11.5–15.5)
WBC: 14.2 10*3/uL — ABNORMAL HIGH (ref 4.0–10.5)

## 2013-12-14 LAB — MAGNESIUM: Magnesium: 1.9 mg/dL (ref 1.5–2.5)

## 2013-12-14 MED ORDER — POTASSIUM CHLORIDE CRYS ER 20 MEQ PO TBCR
40.0000 meq | EXTENDED_RELEASE_TABLET | Freq: Two times a day (BID) | ORAL | Status: AC
  Administered 2013-12-14 (×2): 40 meq via ORAL
  Filled 2013-12-14 (×2): qty 2

## 2013-12-14 MED ORDER — METRONIDAZOLE 500 MG PO TABS
500.0000 mg | ORAL_TABLET | Freq: Three times a day (TID) | ORAL | Status: DC
  Administered 2013-12-14 – 2013-12-15 (×3): 500 mg via ORAL
  Filled 2013-12-14 (×6): qty 1

## 2013-12-14 MED ORDER — CIPROFLOXACIN HCL 500 MG PO TABS
500.0000 mg | ORAL_TABLET | Freq: Two times a day (BID) | ORAL | Status: DC
  Administered 2013-12-14 – 2013-12-15 (×3): 500 mg via ORAL
  Filled 2013-12-14 (×5): qty 1

## 2013-12-14 MED ORDER — PANTOPRAZOLE SODIUM 40 MG PO TBEC
40.0000 mg | DELAYED_RELEASE_TABLET | Freq: Every day | ORAL | Status: DC
  Administered 2013-12-14 – 2013-12-15 (×2): 40 mg via ORAL
  Filled 2013-12-14: qty 1

## 2013-12-14 NOTE — Progress Notes (Signed)
[facility] PROGRESS NOTE  Leta JunglingFransisco Danielski OZH:[id number] DOB: February 04, 1991 DOA: 12/11/2013 PCP: No PCP Per Patient  Assessment/Plan: 23 year old with no PMH presents with abdominal pain, nausea, vomiting and diarrhea. CT abdomen showed:  Wall thickening and fluid in the terminal ileum and colon, with small amount of free fluid in the right lower quadrant and pelvis likely reactive. Changes likely to represent enterocolitis of nonspecific etiology. Borderline appendiceal diameter. Surgery was consulted due to abdominal pain and borderline appendiceal diameter on CT scan. No surgery needed. Patient was started on IV cipro and flgy. He is tolerating clear diet. Abdominal pain has improved, WBC has decreased to 13 from 23. Rotavirus positive. I would continue with cipro and flagyl due to leukocytosis on admission. Plan to advance diet today. Diarrhea persist.   1. Colitis; terminal ileitis;  -CT  abdomen; Wall thickening and fluid in the terminal ileum and colon, with small amount of free fluid in the right lower quadrant and pelvis likely reactive. Changes likely to represent enterocolitis of nonspecific etiology. Borderline appendiceal diameter. -No family history of chron diseases or ulcerative colitis.  -Continue with ciprofloxacin and flagyl day 3.  -WBC decrease to 14 from 23.  -If no improvement of condition will consider consult GI.  -GI pathogen positive for rotavirus. -Surgery sign off.  -Diarrhea persist. He relates 4 to 5 watery stool.   2. Syncope; likely vaso-vagal. Patient relates episode happen after severe episode of abdominal pain.. Continue with IV fluids.   3. DVT prophylaxis; SCD. Out of bed.  4-Hypokalemia; replace Potassium.  5-Health screening;  HIV negative.    Code Status: full code.  Family Communication: care discussed with patient and wife who was at bedside.  Disposition Plan: remain inpatient. Home when tolerates diet and diarrhea improved.     Consultants:  surgery  Procedures:  none  Antibiotics:  Ciprofloxacin 4-19  Flagyl; 4-19  HPI/Subjective: Tolerating clear diet. Abdominal pain better 1/10.  Relates 5 watery stool yesterday.    Objective: Filed Vitals:   12/14/13 0454  BP: 109/55  Pulse: 67  Temp: 98.4 F (36.9 C)  Resp: 16    Intake/Output Summary (Last 24 hours) at 12/14/13 [id number] Last data filed at 12/13/13 2130  Gross per 24 hour  Intake    480 ml  Output      0 ml  Net    480 ml   Filed Weights   12/11/13 2038  Weight: 81.647 kg (180 lb)    Exam:   General:  Alert , awake. In no distress.   Cardiovascular: S 1, S 2 RRR  Respiratory: CTA  Abdomen: BS present, soft, mild generalized tenderness. No rigidity.   Musculoskeletal: no edema.   Data Reviewed: Basic Metabolic Panel:  Recent Labs Lab 12/11/13 2100 12/12/13 0812 12/13/13 0340 12/14/13 0435  NA 138 138 138 137  K 3.4* 3.6* 3.6* 3.6*  CL 101 104 105 104  CO2 22 20 19 19   GLUCOSE 120* 121* 106* 100*  BUN 18 13 12 10   CREATININE 0.89 0.83 0.77 0.74  CALCIUM 9.3 8.5 8.9 8.6  MG  --  1.7  --  1.9  PHOS  --  2.9  --   --    Liver Function Tests:  Recent Labs Lab 12/11/13 2100 12/12/13 0812  AST 19 19  ALT 18 14  ALKPHOS 69 62  BILITOT 0.7 1.1  PROT 7.0 6.4  ALBUMIN 4.1 3.5    Recent Labs Lab 12/11/13 2100  LIPASE 18  No results found for this basename: AMMONIA,  in the last 168 hours CBC:  Recent Labs Lab 12/11/13 2100 12/12/13 0812 12/13/13 0340 12/14/13 0435  WBC 15.9* 23.2* 18.3* 14.2*  NEUTROABS 12.5*  --   --   --   HGB 16.3 14.4 14.0 13.6  HCT 47.1 43.1 42.0 40.6  MCV 86.7 87.2 89.0 88.5  PLT 201 188 171 199   Cardiac Enzymes: No results found for this basename: CKTOTAL, CKMB, CKMBINDEX, TROPONINI,  in the last 168 hours BNP (last 3 results) No results found for this basename: PROBNP,  in the last 8760 hours CBG: No results found for this basename: GLUCAP,  in the last 168  hours  No results found for this or any previous visit (from the past 240 hour(s)).   Studies: No results found.  Scheduled Meds: . ciprofloxacin  500 mg Oral BID  . metroNIDAZOLE  500 mg Oral 3 times per day  . pantoprazole  40 mg Oral Daily  . potassium chloride  40 mEq Oral BID  . sodium chloride  3 mL Intravenous Q12H   Continuous Infusions: . sodium chloride 150 mL/hr (12/14/13 0153)    Active Problems:   Colitis   Hypokalemia    Time spent: 30 minutes.     Loney Peto A Shonta Phillis  [facility] Pager [phone]. If 7PM-7AM, please contact night-coverage at [email or web address], password [email or web address] 12/14/2013, 7:22 AM  LOS: 3 days

## 2013-12-15 DIAGNOSIS — S0100XA Unspecified open wound of scalp, initial encounter: Secondary | ICD-10-CM

## 2013-12-15 DIAGNOSIS — S0101XA Laceration without foreign body of scalp, initial encounter: Secondary | ICD-10-CM | POA: Diagnosis present

## 2013-12-15 DIAGNOSIS — R55 Syncope and collapse: Secondary | ICD-10-CM | POA: Diagnosis present

## 2013-12-15 DIAGNOSIS — A080 Rotaviral enteritis: Secondary | ICD-10-CM

## 2013-12-15 LAB — BASIC METABOLIC PANEL
BUN: 9 mg/dL (ref 6–23)
CO2: 19 mEq/L (ref 19–32)
Calcium: 8.8 mg/dL (ref 8.4–10.5)
Chloride: 103 mEq/L (ref 96–112)
Creatinine, Ser: 0.74 mg/dL (ref 0.50–1.35)
GFR calc Af Amer: >90 mL/min (ref >90)
GFR calc non Af Amer: >90 mL/min (ref >90)
Glucose, Bld: 100 mg/dL — ABNORMAL HIGH (ref 70–99)
Potassium: 3.9 mEq/L (ref 3.7–5.3)
Sodium: 136 mEq/L — ABNORMAL LOW (ref 137–147)

## 2013-12-15 LAB — CBC
HCT: 41.5 % (ref 39.0–52.0)
Hemoglobin: 14.1 g/dL (ref 13.0–17.0)
MCH: 29.9 pg (ref 26.0–34.0)
MCHC: 34 g/dL (ref 30.0–36.0)
MCV: 87.9 fL (ref 78.0–100.0)
Platelets: 208 10*3/uL (ref 150–400)
RBC: 4.72 MIL/uL (ref 4.22–5.81)
RDW: 13.1 % (ref 11.5–15.5)
WBC: 12 10*3/uL — ABNORMAL HIGH (ref 4.0–10.5)

## 2013-12-15 MED ORDER — LOPERAMIDE HCL 2 MG PO TABS: 2.0000 mg | ORAL_TABLET | ORAL | Status: DC | PRN

## 2013-12-15 NOTE — Discharge Summary (Signed)
Physician Discharge Summary  Leta JunglingFransisco Gedeon ZOX:[id number] DOB: [birth year] DOA: 12/11/2013  PCP: No PCP Per Patient  Admit date: 12/11/2013 Discharge date: 12/15/2013  Time spent: greater than 30 minutes  Recommendations for Outpatient Follow-up:  Drink plenty of liquids  Discharge Diagnoses:  Principal Problem:   Rotavirus enteritis Active Problems:   Hypokalemia   Vasovagal syncope   Scalp laceration   Discharge Condition: stable  Filed Weights   12/11/13 2038  Weight: 81.647 kg (180 lb)    History of present illness:  23 y.o. male  has no past medical history on file.  Presented with  Severe abdominal pain for the past 24 hours. Denies diarrhea normal BM yesterday, He had some nausea and vomitng in ER but not prior. CT scan showed colitis, Hospitalist called for admission. 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 He had a slight ever earlier in the day. Family denies any exposure to under cooked meat. Patient initially had a syncopal episode resulting in head laceration that has been repaired in ER. Family did not provide much information regarding this. Most likely in response to pain.   Hospital Course:  Admitted to hospitalists, medsurg. Started on empiric cipro, IV fluid and supportive care. GI pathogen panel positive for rotavirus. Antibiotics stopped, diet advanced. By discharge, pain resolved. Tolerating solid food, and diarrhea nearly resolved  Procedures:  none  Consultations:  none  Discharge Exam: Filed Vitals:   12/15/13 0452  BP: 114/64  Pulse: 68  Temp: 98.3 F (36.8 C)  Resp: 16    General: nontoxic Cardiovascular: RRR Respiratory: CTA Abd: S, NT, ND  Discharge Orders   Future Orders Complete By Expires   Activity as tolerated - No restrictions  As directed    Diet general  As directed    Discharge instructions  As directed        Medication List         ibuprofen 200 MG tablet  Commonly known as:   ADVIL,MOTRIN  Take 200 mg by mouth every 6 (six) hours as needed.     loperamide 2 MG tablet  Commonly known as:  IMODIUM A-D  Take 1 tablet (2 mg total) by mouth as needed for diarrhea or loose stools.       No Known Allergies     Follow-up Information   [facility] 1 week. (to remove staples)    Specialty:  Urgent Care   Contact information:   [address] [place] [address] [phone]       The results of significant diagnostics from this hospitalization (including imaging, microbiology, ancillary and laboratory) are listed below for reference.    Significant Diagnostic Studies: Ct Head Wo Contrast  12/11/2013   CLINICAL DATA:  Syncopal episode, struck back of head. Abdominal pain, nausea, and vomiting.  EXAM: CT HEAD WITHOUT CONTRAST  TECHNIQUE: Contiguous axial images were obtained from the base of the skull through the vertex without intravenous contrast.  COMPARISON:  None.  FINDINGS: Ventricles and sulci are symmetrical. No mass effect or midline shift. No abnormal extra-axial fluid collections. Gray-white matter junctions are distinct. Basal cisterns are not effaced. No evidence of acute intracranial hemorrhage. No depressed skull fractures. Mild mucosal thickening in the paranasal sinuses.  IMPRESSION: No acute intracranial abnormalities.   Electronically Signed   By: Burman NievesWilliam  Stevens M.D.   On: 12/11/2013 22:13   Ct Abdomen Pelvis W Contrast  12/11/2013  CLINICAL DATA:  Abdominal pain, nausea and vomiting. Syncopal episode. Sudden onset of pain.  EXAM: CT ABDOMEN AND PELVIS WITH CONTRAST  TECHNIQUE: Multidetector CT imaging of the abdomen and pelvis was performed using the standard protocol following bolus administration of intravenous contrast.  CONTRAST:  80mL OMNIPAQUE IOHEXOL 300 MG/ML  SOLN  COMPARISON:  None.  FINDINGS: Lung bases are clear. The liver, spleen, gallbladder, pancreas, adrenal glands,  kidneys, abdominal aorta, and retroperitoneal lymph nodes are unremarkable. The stomach and most of the small bowel are decompressed. The colon is diffusely fluid-filled, most predominantly in the right colon and the terminal ileum is also fluid-filled with air-fluid levels. There is no small bowel distention. There is evidence of small bowel wall thickening of the terminal ileum and intermittent wall thickening throughout the colon. Changes suggest an inflammatory process such is antra colitis. This could represent infectious or inflammatory etiology. Ischemia is felt less likely as there is no portal venous gas, pneumatosis, and flow in the mesenteric vessels is normal appearing. The appendix measures 8 mm in diameter. This is borderline and early appendicitis is not excluded. Small amount of free fluid in the right lower quadrant and mesenteries likely reactive.  Pelvis: No evidence of diverticulitis. The bladder wall is not thickened. Small amount of free fluid in the pelvis is likely to be reactive. No destructive bone lesions appreciated.  IMPRESSION: Wall thickening and fluid in the terminal ileum and colon, with small amount of free fluid in the right lower quadrant and pelvis likely reactive. Changes likely to represent enterocolitis of nonspecific etiology. Borderline appendiceal diameter.   Electronically Signed   By: Burman NievesWilliam  Stevens M.D.   On: 12/11/2013 22:24   Dg Abd Acute W/chest  12/11/2013   CLINICAL DATA:  Acute lower abdominal pain.  EXAM: ACUTE ABDOMEN SERIES (ABDOMEN 2 VIEW & CHEST 1 VIEW)  COMPARISON:  CT abdomen and pelvis 12/11/2013  FINDINGS: Mild prominence of gas-filled loops of mid abdominal small bowel with some air-fluid levels noted on the decubitus views. No radiopaque stones. Residual contrast material in the urinary tract from recent CT. Shallow inspiration. Lungs are clear. Normal heart size and pulmonary vascularity.  IMPRESSION: Nonspecific gas-filled loops of mid abdominal  small bowel with air-fluid levels but without significant distention. No evidence of active pulmonary disease.   Electronically Signed   By: Burman NievesWilliam  Stevens M.D.   On: 12/11/2013 22:48    Microbiology: No results found for this or any previous visit (from the past 240 hour(s)).   Labs: Basic Metabolic Panel:  Recent Labs Lab 12/11/13 2100 12/12/13 0812 12/13/13 0340 12/14/13 0435 12/15/13 0425  NA 138 138 138 137 136*  K 3.4* 3.6* 3.6* 3.6* 3.9  CL 101 104 105 104 103  CO2 22 20 19 19 19   GLUCOSE 120* 121* 106* 100* 100*  BUN 18 13 12 10 9   CREATININE 0.89 0.83 0.77 0.74 0.74  CALCIUM 9.3 8.5 8.9 8.6 8.8  MG  --  1.7  --  1.9  --   PHOS  --  2.9  --   --   --    Liver Function Tests:  Recent Labs Lab 12/11/13 2100 12/12/13 0812  AST 19 19  ALT 18 14  ALKPHOS 69 62  BILITOT 0.7 1.1  PROT 7.0 6.4  ALBUMIN 4.1 3.5    Recent Labs Lab 12/11/13 2100  LIPASE 18   No results found for this basename: AMMONIA,  in the last 168 hours CBC:  Recent Labs  Lab 12/11/13 2100 12/12/13 0812 12/13/13 0340 12/14/13 0435 12/15/13 0425  WBC 15.9* 23.2* 18.3* 14.2* 12.0*  NEUTROABS 12.5*  --   --   --   --   HGB 16.3 14.4 14.0 13.6 14.1  HCT 47.1 43.1 42.0 40.6 41.5  MCV 86.7 87.2 89.0 88.5 87.9  PLT 201 188 171 199 208   Cardiac Enzymes: No results found for this basename: CKTOTAL, CKMB, CKMBINDEX, TROPONINI,  in the last 168 hours BNP: BNP (last 3 results) No results found for this basename: PROBNP,  in the last 8760 hours CBG: No results found for this basename: GLUCAP,  in the last 168 hours     Signed:  Christiane Ha  [facility] 12/15/2013, 11:19 AM

## 2013-12-15 NOTE — Progress Notes (Signed)
Patient discharged to home with instructions. 

## 2013-12-25 ENCOUNTER — Emergency Department (HOSPITAL_COMMUNITY)
Admission: EM | Admit: 2013-12-25 | Discharge: 2013-12-25 | Disposition: A | Discharge status: Home / Self Care | Payer: MEDICAID | Attending: Emergency Medicine | Admitting: Emergency Medicine

## 2013-12-25 ENCOUNTER — Encounter (HOSPITAL_COMMUNITY): Payer: Self-pay | Admitting: Emergency Medicine

## 2013-12-25 DIAGNOSIS — Z4802 Encounter for removal of sutures: Secondary | ICD-10-CM | POA: Insufficient documentation

## 2013-12-25 NOTE — ED Provider Notes (Signed)
LACERATION REPAIR Performed by: Arnoldo HookerShari A Alba Perillo Authorized by: Arnoldo HookerShari A Davied Nocito Consent: Verbal consent obtained. Risks and benefits: risks, benefits and alternatives were discussed Consent given by: patient Patient identity confirmed: provided demographic data Prepped and Draped in normal sterile fashion Wound explored  Laceration Location: occiput  Laceration Length: 3cm  No Foreign Bodies seen or palpated  Anesthesia: local infiltration  Local anesthetic: lidocaine 2% w/o epinephrine  Anesthetic total: 2 ml  Irrigation method: syringe Amount of cleaning: standard  Skin closure: staples  Number of sutures: 3  Technique: staples  Patient tolerance: Patient tolerated the procedure well with no immediate complications.   Arnoldo HookerShari A Leilany Digeronimo, PA-C 12/25/13 0150

## 2013-12-25 NOTE — ED Notes (Signed)
hes here to have staples removed from back of his head. States they are healing well and he does not have any pain

## 2013-12-25 NOTE — ED Provider Notes (Signed)
History/physical exam/procedure(s) were performed by non-physician practitioner and as supervising physician I was immediately available for consultation/collaboration. I have reviewed all notes and am in agreement with care and plan.   Samika Vetsch S Crystie Yanko, MD 12/25/13 1837 

## 2013-12-25 NOTE — ED Notes (Signed)
H Laisure, PA, in w/pt. 

## 2013-12-25 NOTE — Discharge Instructions (Signed)
Cuidados posteriores a la remoción de las grapas  (Staple Removal, Care After)  Las grapas utilizadas para suturar su piel han sido retiradas. La herida requiere un cuidado continuo de modo que pueda curarse completamente sin problemas. Los cuidados que se indican aquí deberán realizarse entre 5 y 10 días excepto que su médico le indique otra cosa.   INSTRUCCIONES PARA EL CUIDADO DOMICILIARIO  · Mantenga la herida limpia y seca.  · Si le aplicaron tiras de pegamento de la piel después de retirarle las grapas, se despegarán en algunos días. Si luego de 14 días permanecen en el lugar deben despegarse y descartarse.  · Si le han colocado un vendaje, cámbielo por lo menos una vez por día o según lo que le recomiende el médico. Si el vendaje se adhiere, remójelo con una solución de peróxido de hidrógeno (agua oxigenada). Seque dando palmaditas con un paño limpio y seco. Observe si existen signos de infección (ver más abajo).  · Vuelva a aplicar crema o ungüento según las indicaciones del médico. Esto le ayudará a prevenir las infecciones y a evitar que el vendaje se adhiera. Una venda no adhesiva sobre la herida y debajo del vendaje también evitará que éste se adhiera.  · Si el vendaje se moja, se ensucia o presenta un olor fétido, cámbielo tan pronto como pueda.  · Las nuevas cicatrices toman color oscuro fácilmente cuando se exponen al sol. Utilice pantallas solar con al menos un factor de protección (SPF) 15 cuando salga al sol.  · Sólo tome medicamentos de venta libre o prescriptos para calmar el dolor, las molestias, o bajar la fiebre según las indicaciones de su médico.  SOLICITE ATENCIÓN MÉDICA DE INMEDIATO SI:  · Presenta enrojecimiento, hinchazón o aumento del dolor en la herida.  · Aparece pus en la herida.  · Presenta una temperatura oral superior a 38,9° C (102° F).  · Advierte un olor fétido que proviene de la herida o del vendaje.  · La herida se abre (los bordes no se mantienen juntos) luego de la remoción  de las suturas.  Document Released: 11/08/2008 Document Revised: 11/04/2011  Exit[employer]® Patient Information ©2014 [employer].

## 2013-12-25 NOTE — ED Notes (Signed)
H Laisure, PA, removing staples.

## 2013-12-25 NOTE — ED Provider Notes (Signed)
Medical screening examination/treatment/procedure(s) were performed by non-physician practitioner and as supervising physician I was immediately available for consultation/collaboration.   EKG Interpretation None       Tanika Bracco R. Boby Eyer, MD 12/25/13 1518 

## 2013-12-25 NOTE — ED Provider Notes (Signed)
CSN: [id number]     Arrival date & time 12/25/13  0950 History   This chart was scribed for Santiago GladHeather Hong Moring, PA-C  working with Juliet RudeNathan R. Rubin PayorPickering, MD by Ashley JacobsBrittany Andrews, ED scribe. This patient was seen in room TR08C/TR08C and the patient's care was started at 10:22 AM.    First MD Initiated Contact with Patient 12/25/13 [phone]     Chief Complaint  Patient presents with  . Suture / Staple Removal     (Consider location/radiation/quality/duration/timing/severity/associated sxs/prior Treatment) The history is provided by the patient and medical records. A language interpreter was used.   HPI Comments: Leta JunglingFransisco Brayboy is a 23 y.o. male who presents to the Emergency Department for a suture/staple removal to his posterior scalp. He was treated for a 3 cm laceration to his occiput 14 days ago. No foreign bodies were noted to the area.  Pt denies drainage. Denies fever and chills.    History reviewed. No pertinent past medical history. History reviewed. No pertinent past surgical history. History reviewed. No pertinent family history. History  Substance Use Topics  . Smoking status: Never Smoker   . Smokeless tobacco: Not on file  . Alcohol Use: No    Review of Systems  Constitutional: Negative for fever and chills.  Skin: Positive for wound (suture/staple removal from prior laceration to the occiput).       No drainage from the affected area.      Allergies  Review of patient's allergies indicates no known allergies.  Home Medications   Prior to Admission medications   Medication Sig Start Date End Date Taking? Authorizing Provider  ibuprofen (ADVIL,MOTRIN) 200 MG tablet Take 200 mg by mouth every 6 (six) hours as needed.    Historical Provider, MD  loperamide (IMODIUM A-D) 2 MG tablet Take 1 tablet (2 mg total) by mouth as needed for diarrhea or loose stools. 12/15/13   Christiane Haorinna L Sullivan, MD   BP 113/80  Pulse 72  Temp(Src) 98.2 F (36.8 C) (Oral)  Resp 15  SpO2  99% Physical Exam  Nursing note and vitals reviewed. Constitutional: He appears well-developed and well-nourished. No distress.  HENT:  Head: Normocephalic.  Cardiovascular: Normal rate, regular rhythm and normal heart sounds.   Pulmonary/Chest: Effort normal and breath sounds normal.  Neurological: He is alert.  Skin: Skin is warm and dry.  Staples to posterior scalp.  No drainage.  No surrounding erythema. No edema. Laceration is well healed.  Psychiatric: He has a normal mood and affect.    ED Course  Procedures (including critical care time) DIAGNOSTIC STUDIES: Oxygen Saturation is 99% on room air, normal by my interpretation.    COORDINATION OF CARE:  10:25 AM Discussed course of care with pt which includes suture removal . Pt understands and agrees.   Labs Review Labs Reviewed - No data to display  Imaging Review No results found.   EKG Interpretation None     SUTURE REMOVAL Performed by: Santiago GladHeather Viridiana Spaid  Consent: Verbal consent obtained. Consent given by: patient Time out: Immediately prior to procedure a "time out" was called to verify the correct patient, procedure, equipment, support staff and site/side marked as required.  Location: posterior  Wound Appearance: clean  Sutures/Staples Removed: 4  Patient tolerance: Patient tolerated the procedure well with no immediate complications.   MDM   Final diagnoses:  None   Patient presenting for staple removal.  No signs of infection.  Staples removed without difficulty.  Patient stable for discharge.  I personally  performed the services described in this documentation, which was scribed in my presence. The recorded information has been reviewed and is accurate.    Santiago GladHeather Almadelia Looman, PA-C 12/25/13 1046

## 2014-09-06 IMAGING — CT CT ABD-PELV W/ CM
2 of 5 series · 11 of 46 positions shown, 12 images · IV contrast (Iodine)
Comparison: None.

CLINICAL DATA: Abdominal pain, nausea and vomiting. Syncopal
episode. Sudden onset of pain.

EXAM:
CT ABDOMEN AND PELVIS WITH CONTRAST
TECHNIQUE: Multidetector CT imaging of the abdomen and pelvis was performed
using the standard protocol following bolus administration of
intravenous contrast.
CONTRAST:  80mL OMNIPAQUE IOHEXOL 300 MG/ML  SOLN

[Series 201: routine, idose (3) · axial · 0.79mm/px · z∈[+57,+447]mm · 8 of 100 slices shown, 9 images]
[im 11/100  soft-tissue]
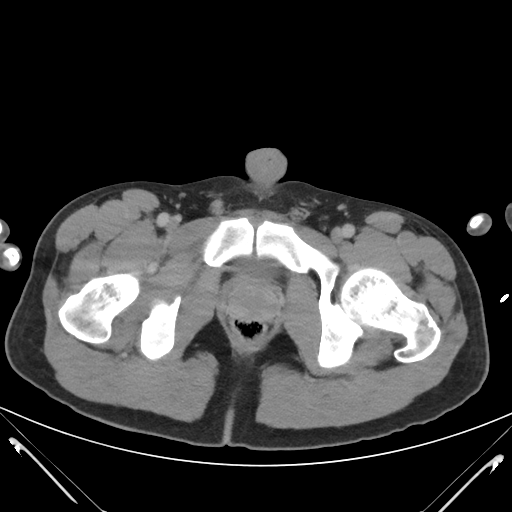
[im 11/100  bone]
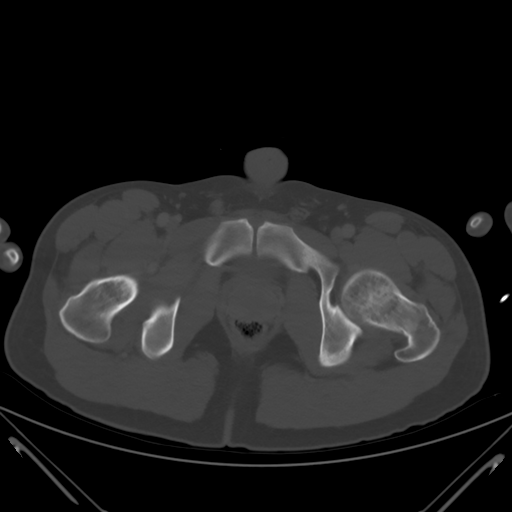
[im 21/100  soft-tissue]
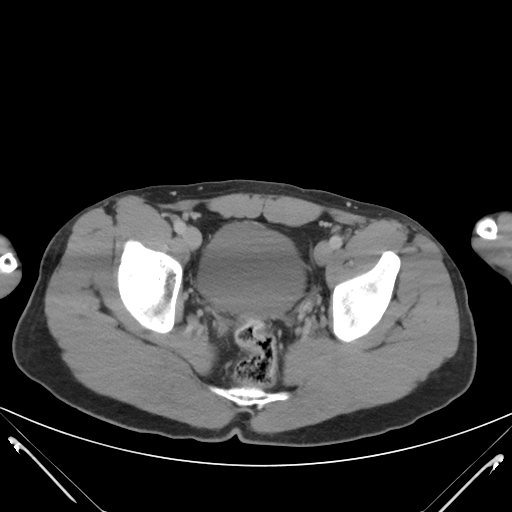
[im 32/100  soft-tissue]
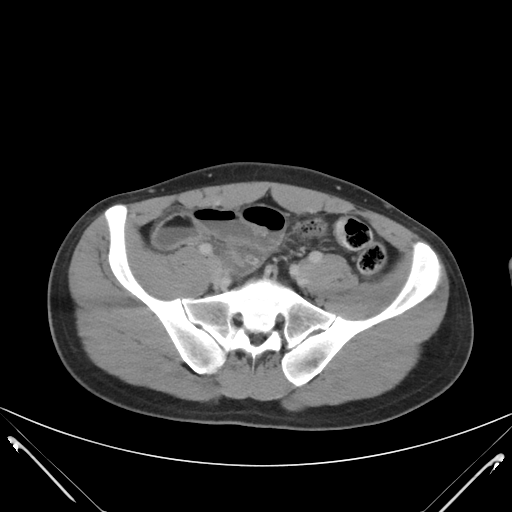
[im 42/100  soft-tissue]
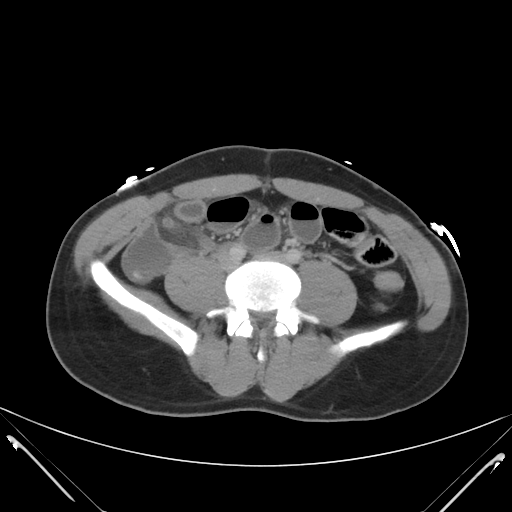
[im 58/100  soft-tissue]
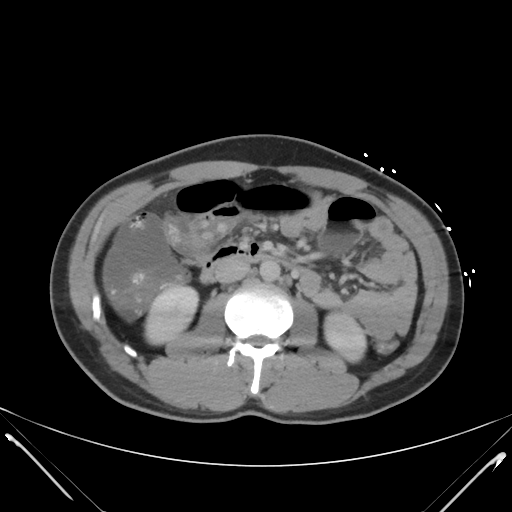
[im 68/100  soft-tissue]
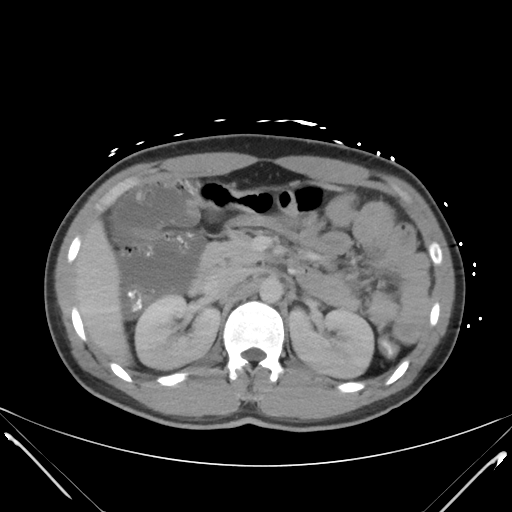
[im 79/100  soft-tissue]
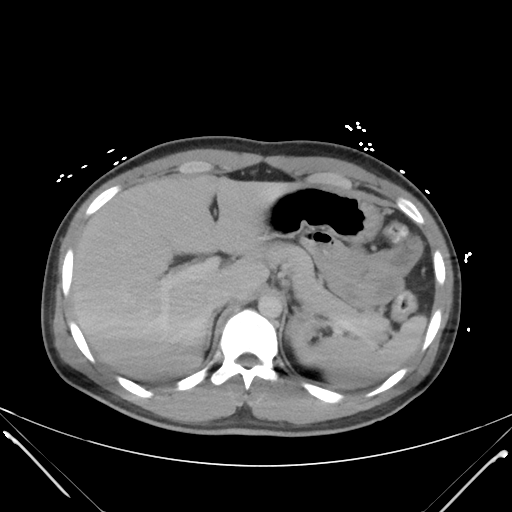
[im 89/100  soft-tissue]
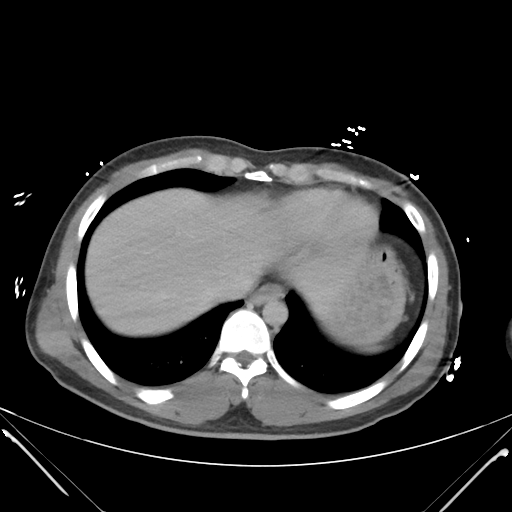

[Series 204: coronals, idose (3) · coronal · 0.50mm/px · 3 of 132 slices shown]
[im 44/132  soft-tissue]
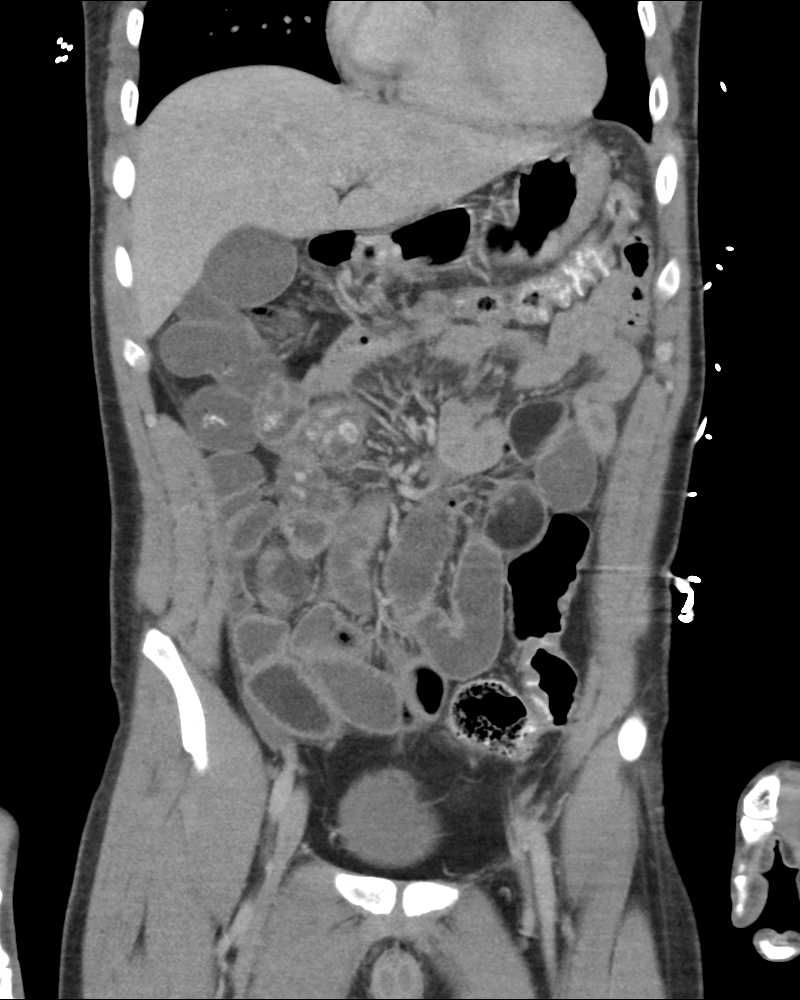
[im 59/132  soft-tissue]
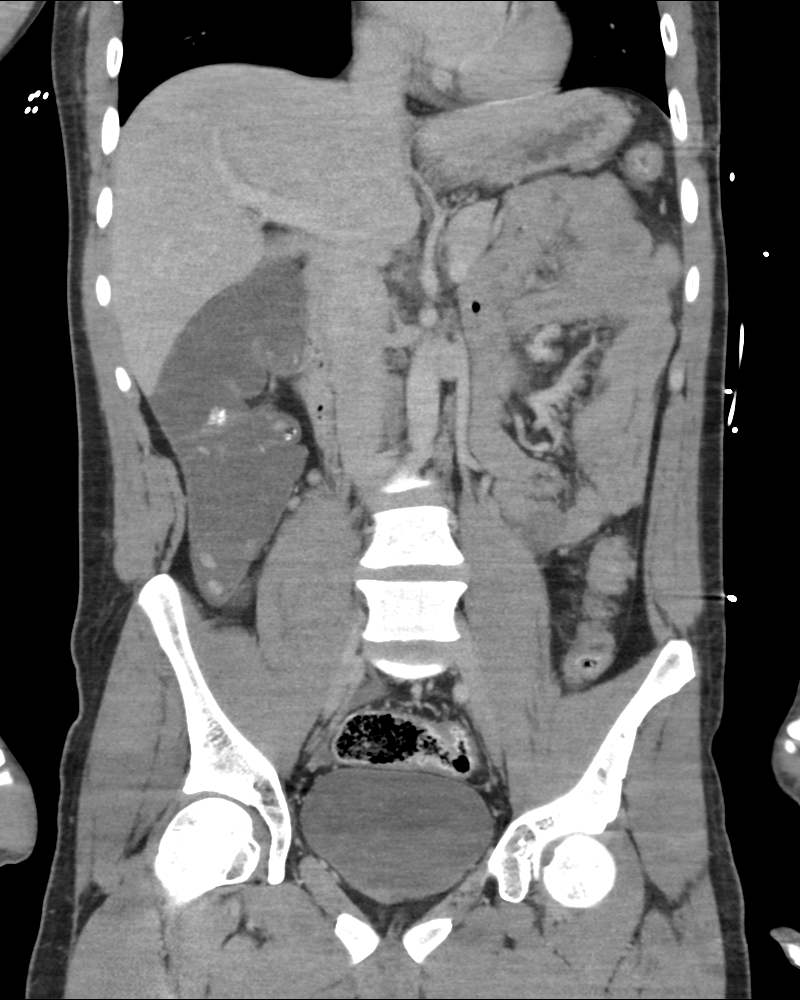
[im 73/132  soft-tissue]
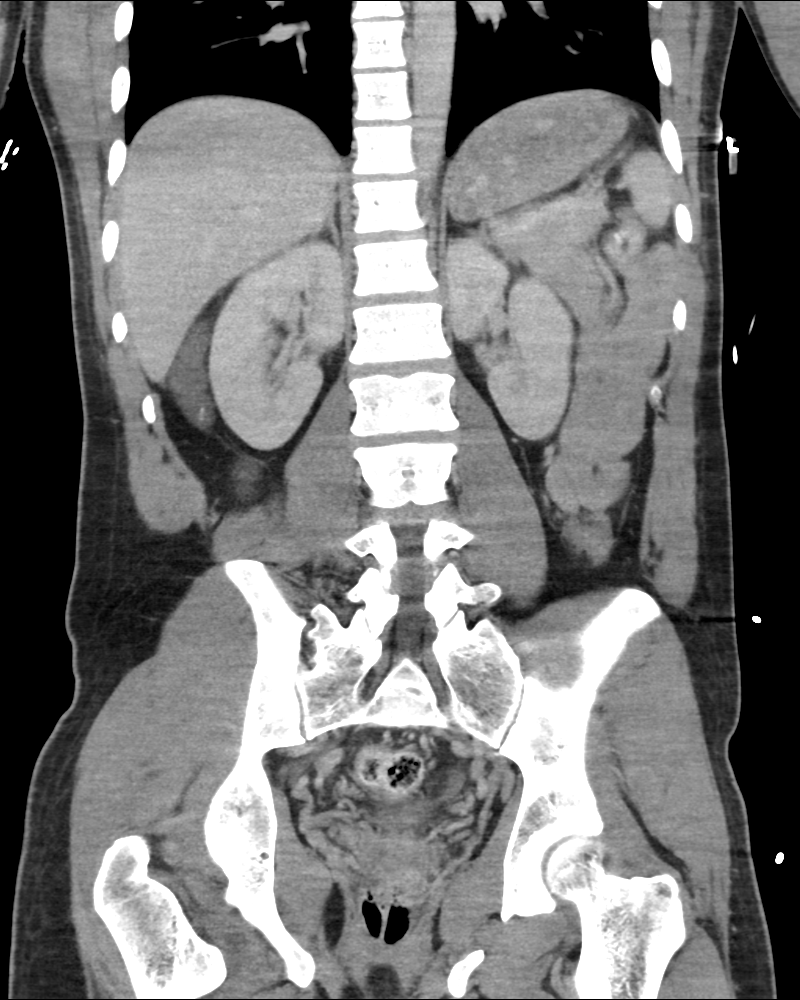

[11 of 46 positions shown; findings below may reference images not displayed]

FINDINGS: Lung bases are clear. The liver, spleen, gallbladder, pancreas,
adrenal glands, kidneys, abdominal aorta, and retroperitoneal lymph
nodes are unremarkable. The stomach and most of the small bowel are
decompressed. The colon is diffusely fluid-filled, most
predominantly in the right colon and the terminal ileum is also
fluid-filled with air-fluid levels. There is no small bowel
distention. There is evidence of small bowel wall thickening of the
terminal ileum and intermittent wall thickening throughout the
colon. Changes suggest an inflammatory process such is antra
colitis. This could represent infectious or inflammatory etiology.
Ischemia is felt less likely as there is no portal venous gas,
pneumatosis, and flow in the mesenteric vessels is normal appearing.
The appendix measures 8 mm in diameter. This is borderline and early
appendicitis is not excluded. Small amount of free fluid in the
right lower quadrant and mesenteries likely reactive.

Pelvis: No evidence of diverticulitis. The bladder wall is not
thickened. Small amount of free fluid in the pelvis is likely to be
reactive. No destructive bone lesions appreciated.
IMPRESSION: Wall thickening and fluid in the terminal ileum and colon, with
small amount of free fluid in the right lower quadrant and pelvis
likely reactive. Changes likely to represent enterocolitis of
nonspecific etiology. Borderline appendiceal diameter.
